# Patient Record
Sex: Female | Born: 1985 | Race: White | Hispanic: No | Marital: Single | Smoking: Current every day smoker
Health system: Southern US, Community
[De-identification: ages and names within clinical notes are randomized; demographics above are authoritative.]

## PROBLEM LIST (undated history)

## (undated) DIAGNOSIS — O00109 Unspecified tubal pregnancy without intrauterine pregnancy: Secondary | ICD-10-CM

## (undated) DIAGNOSIS — F988 Other specified behavioral and emotional disorders with onset usually occurring in childhood and adolescence: Secondary | ICD-10-CM

## (undated) DIAGNOSIS — Q5128 Other doubling of uterus, other specified: Secondary | ICD-10-CM

## (undated) DIAGNOSIS — A549 Gonococcal infection, unspecified: Secondary | ICD-10-CM

## (undated) HISTORY — PX: TONSILLECTOMY: SUR1361

## (undated) HISTORY — DX: Other specified behavioral and emotional disorders with onset usually occurring in childhood and adolescence: F98.8

## (undated) HISTORY — PX: WISDOM TOOTH EXTRACTION: SHX21

---
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
## 2006-05-09 ENCOUNTER — Inpatient Hospital Stay (HOSPITAL_COMMUNITY): Admission: AD | Admit: 2006-05-09 | Discharge: 2006-05-09 | Payer: Self-pay | Admitting: Obstetrics and Gynecology

## 2006-09-01 ENCOUNTER — Ambulatory Visit: Payer: Self-pay | Admitting: Internal Medicine

## 2006-09-01 DIAGNOSIS — F988 Other specified behavioral and emotional disorders with onset usually occurring in childhood and adolescence: Secondary | ICD-10-CM | POA: Insufficient documentation

## 2006-10-17 ENCOUNTER — Telehealth (INDEPENDENT_AMBULATORY_CARE_PROVIDER_SITE_OTHER): Payer: Self-pay | Admitting: *Deleted

## 2006-11-14 ENCOUNTER — Telehealth (INDEPENDENT_AMBULATORY_CARE_PROVIDER_SITE_OTHER): Payer: Self-pay | Admitting: *Deleted

## 2006-12-19 ENCOUNTER — Telehealth (INDEPENDENT_AMBULATORY_CARE_PROVIDER_SITE_OTHER): Payer: Self-pay | Admitting: *Deleted

## 2007-01-18 ENCOUNTER — Telehealth (INDEPENDENT_AMBULATORY_CARE_PROVIDER_SITE_OTHER): Payer: Self-pay | Admitting: *Deleted

## 2007-02-17 ENCOUNTER — Telehealth (INDEPENDENT_AMBULATORY_CARE_PROVIDER_SITE_OTHER): Payer: Self-pay | Admitting: *Deleted

## 2007-03-20 ENCOUNTER — Telehealth (INDEPENDENT_AMBULATORY_CARE_PROVIDER_SITE_OTHER): Payer: Self-pay | Admitting: *Deleted

## 2007-03-30 DIAGNOSIS — Z8619 Personal history of other infectious and parasitic diseases: Secondary | ICD-10-CM

## 2007-03-30 HISTORY — DX: Personal history of other infectious and parasitic diseases: Z86.19

## 2007-04-20 ENCOUNTER — Telehealth: Payer: Self-pay | Admitting: Internal Medicine

## 2007-05-22 ENCOUNTER — Telehealth (INDEPENDENT_AMBULATORY_CARE_PROVIDER_SITE_OTHER): Payer: Self-pay | Admitting: *Deleted

## 2007-06-27 ENCOUNTER — Telehealth (INDEPENDENT_AMBULATORY_CARE_PROVIDER_SITE_OTHER): Payer: Self-pay | Admitting: *Deleted

## 2007-07-04 ENCOUNTER — Ambulatory Visit: Payer: Self-pay | Admitting: Internal Medicine

## 2007-08-25 ENCOUNTER — Telehealth (INDEPENDENT_AMBULATORY_CARE_PROVIDER_SITE_OTHER): Payer: Self-pay | Admitting: *Deleted

## 2007-09-27 ENCOUNTER — Telehealth (INDEPENDENT_AMBULATORY_CARE_PROVIDER_SITE_OTHER): Payer: Self-pay | Admitting: *Deleted

## 2007-10-10 ENCOUNTER — Telehealth (INDEPENDENT_AMBULATORY_CARE_PROVIDER_SITE_OTHER): Payer: Self-pay | Admitting: *Deleted

## 2007-10-27 ENCOUNTER — Telehealth (INDEPENDENT_AMBULATORY_CARE_PROVIDER_SITE_OTHER): Payer: Self-pay | Admitting: *Deleted

## 2007-12-12 ENCOUNTER — Telehealth (INDEPENDENT_AMBULATORY_CARE_PROVIDER_SITE_OTHER): Payer: Self-pay | Admitting: *Deleted

## 2008-01-29 ENCOUNTER — Telehealth (INDEPENDENT_AMBULATORY_CARE_PROVIDER_SITE_OTHER): Payer: Self-pay | Admitting: *Deleted

## 2008-02-29 ENCOUNTER — Telehealth (INDEPENDENT_AMBULATORY_CARE_PROVIDER_SITE_OTHER): Payer: Self-pay | Admitting: *Deleted

## 2008-03-20 ENCOUNTER — Ambulatory Visit: Payer: Self-pay | Admitting: Internal Medicine

## 2008-04-30 ENCOUNTER — Telehealth (INDEPENDENT_AMBULATORY_CARE_PROVIDER_SITE_OTHER): Payer: Self-pay | Admitting: *Deleted

## 2008-05-15 ENCOUNTER — Telehealth (INDEPENDENT_AMBULATORY_CARE_PROVIDER_SITE_OTHER): Payer: Self-pay | Admitting: *Deleted

## 2008-06-14 ENCOUNTER — Telehealth (INDEPENDENT_AMBULATORY_CARE_PROVIDER_SITE_OTHER): Payer: Self-pay | Admitting: *Deleted

## 2008-07-11 ENCOUNTER — Telehealth (INDEPENDENT_AMBULATORY_CARE_PROVIDER_SITE_OTHER): Payer: Self-pay | Admitting: *Deleted

## 2008-08-05 ENCOUNTER — Telehealth (INDEPENDENT_AMBULATORY_CARE_PROVIDER_SITE_OTHER): Payer: Self-pay | Admitting: *Deleted

## 2008-08-28 ENCOUNTER — Ambulatory Visit: Payer: Self-pay | Admitting: Internal Medicine

## 2008-09-25 ENCOUNTER — Telehealth (INDEPENDENT_AMBULATORY_CARE_PROVIDER_SITE_OTHER): Payer: Self-pay | Admitting: *Deleted

## 2008-10-28 ENCOUNTER — Telehealth (INDEPENDENT_AMBULATORY_CARE_PROVIDER_SITE_OTHER): Payer: Self-pay | Admitting: *Deleted

## 2008-11-26 ENCOUNTER — Telehealth (INDEPENDENT_AMBULATORY_CARE_PROVIDER_SITE_OTHER): Payer: Self-pay | Admitting: *Deleted

## 2008-12-23 ENCOUNTER — Telehealth (INDEPENDENT_AMBULATORY_CARE_PROVIDER_SITE_OTHER): Payer: Self-pay | Admitting: *Deleted

## 2009-01-21 ENCOUNTER — Telehealth (INDEPENDENT_AMBULATORY_CARE_PROVIDER_SITE_OTHER): Payer: Self-pay | Admitting: *Deleted

## 2009-02-18 ENCOUNTER — Telehealth (INDEPENDENT_AMBULATORY_CARE_PROVIDER_SITE_OTHER): Payer: Self-pay | Admitting: *Deleted

## 2009-02-19 ENCOUNTER — Emergency Department (HOSPITAL_COMMUNITY): Admission: EM | Admit: 2009-02-19 | Discharge: 2009-02-19 | Payer: Self-pay | Admitting: Emergency Medicine

## 2009-03-19 ENCOUNTER — Telehealth (INDEPENDENT_AMBULATORY_CARE_PROVIDER_SITE_OTHER): Payer: Self-pay | Admitting: *Deleted

## 2009-04-21 ENCOUNTER — Telehealth (INDEPENDENT_AMBULATORY_CARE_PROVIDER_SITE_OTHER): Payer: Self-pay | Admitting: *Deleted

## 2009-05-19 ENCOUNTER — Telehealth (INDEPENDENT_AMBULATORY_CARE_PROVIDER_SITE_OTHER): Payer: Self-pay | Admitting: *Deleted

## 2009-06-13 ENCOUNTER — Telehealth (INDEPENDENT_AMBULATORY_CARE_PROVIDER_SITE_OTHER): Payer: Self-pay | Admitting: *Deleted

## 2009-07-09 ENCOUNTER — Telehealth (INDEPENDENT_AMBULATORY_CARE_PROVIDER_SITE_OTHER): Payer: Self-pay | Admitting: *Deleted

## 2009-08-04 ENCOUNTER — Telehealth (INDEPENDENT_AMBULATORY_CARE_PROVIDER_SITE_OTHER): Payer: Self-pay | Admitting: *Deleted

## 2009-08-29 ENCOUNTER — Telehealth (INDEPENDENT_AMBULATORY_CARE_PROVIDER_SITE_OTHER): Payer: Self-pay | Admitting: *Deleted

## 2009-09-30 ENCOUNTER — Ambulatory Visit: Payer: Self-pay | Admitting: Internal Medicine

## 2009-10-29 ENCOUNTER — Telehealth (INDEPENDENT_AMBULATORY_CARE_PROVIDER_SITE_OTHER): Payer: Self-pay | Admitting: *Deleted

## 2009-11-27 ENCOUNTER — Telehealth (INDEPENDENT_AMBULATORY_CARE_PROVIDER_SITE_OTHER): Payer: Self-pay | Admitting: *Deleted

## 2009-12-29 ENCOUNTER — Telehealth (INDEPENDENT_AMBULATORY_CARE_PROVIDER_SITE_OTHER): Payer: Self-pay | Admitting: *Deleted

## 2010-01-30 ENCOUNTER — Telehealth (INDEPENDENT_AMBULATORY_CARE_PROVIDER_SITE_OTHER): Payer: Self-pay | Admitting: *Deleted

## 2010-02-24 ENCOUNTER — Telehealth: Payer: Self-pay | Admitting: Internal Medicine

## 2010-02-26 ENCOUNTER — Ambulatory Visit: Payer: Self-pay | Admitting: Internal Medicine

## 2010-02-26 DIAGNOSIS — G5600 Carpal tunnel syndrome, unspecified upper limb: Secondary | ICD-10-CM | POA: Insufficient documentation

## 2010-03-02 LAB — CONVERTED CEMR LAB
Free T4: 0.78 ng/dL (ref 0.60–1.60)
TSH: 1.45 microintl units/mL (ref 0.35–5.50)

## 2010-03-26 ENCOUNTER — Telehealth (INDEPENDENT_AMBULATORY_CARE_PROVIDER_SITE_OTHER): Payer: Self-pay | Admitting: *Deleted

## 2010-04-27 ENCOUNTER — Telehealth (INDEPENDENT_AMBULATORY_CARE_PROVIDER_SITE_OTHER): Payer: Self-pay | Admitting: *Deleted

## 2010-04-28 ENCOUNTER — Telehealth (INDEPENDENT_AMBULATORY_CARE_PROVIDER_SITE_OTHER): Payer: Self-pay | Admitting: *Deleted

## 2010-04-28 NOTE — Progress Notes (Signed)
Summary: refill  Phone Note Refill Request Call back at Home Phone [phone]   Refills Requested: Medication #1:  AMPHETAMINE SALT COMBO 15 MG  TABS 1 two times a day prn. call patient when  ready  Initial call taken by: Okey Regal Spring,  August 29, 2009 11:16 AM  Follow-up for Phone Call        Spoke with patient, informed rx is avaliable for pick-up Follow-up by: Shonna Chock,  August 29, 2009 11:39 AM    New/Updated Medications: AMPHETAMINE SALT COMBO 15 MG  TABS (AMPHETAMINE-DEXTROAMPHETAMINE) 1 two times a day as needed **ADDITIONAL REFILLS REQUIRE AN APPOINTMENT** Prescriptions: AMPHETAMINE SALT COMBO 15 MG  TABS (AMPHETAMINE-DEXTROAMPHETAMINE) 1 two times a day as needed **ADDITIONAL REFILLS REQUIRE AN APPOINTMENT**  #60 x 0   Entered by:   Shonna Chock   Authorized by:   Marga Melnick MD   Signed by:   Shonna Chock on 08/29/2009   Method used:   Print then Give to Patient   RxID:   [id number]

## 2010-04-28 NOTE — Progress Notes (Signed)
Summary: Refill Request  Phone Note Refill Request Call back at Home Phone [phone] Message from:  Patient  Refills Requested: Medication #1:  AMPHETAMINE SALT COMBO 15 MG  TABS 1 two times a day prn.  Method Requested: Pick up at Office Initial call taken by: Shonna Chock,  April 21, 2009 10:48 AM  Follow-up for Phone Call        Spoke with patient, patient aware rx is ready for pick-up Follow-up by: Shonna Chock,  April 21, 2009 10:48 AM    Prescriptions: AMPHETAMINE SALT COMBO 15 MG  TABS (AMPHETAMINE-DEXTROAMPHETAMINE) 1 two times a day prn  #60 x 0   Entered by:   Shonna Chock   Authorized by:   Marga Melnick MD   Signed by:   Shonna Chock on 04/21/2009   Method used:   Print then Give to Patient   RxID:   [id number]

## 2010-04-28 NOTE — Progress Notes (Signed)
Summary: refill  Phone Note Refill Request Message from:  Patient on December 29, 2009 1:37 PM  Refills Requested: Medication #1:  AMPHETAMINE SALT COMBO 15 MG  TABS 1 two times a day as needed patient will pick up [id number]  Initial call taken by: Okey Regal Spring,  December 29, 2009 1:37 PM    Prescriptions: AMPHETAMINE SALT COMBO 15 MG  TABS (AMPHETAMINE-DEXTROAMPHETAMINE) 1 two times a day as needed  #60 x 0   Entered by:   Shonna Chock CMA   Authorized by:   Marga Melnick MD   Signed by:   Shonna Chock CMA on 12/30/2009   Method used:   Print then Give to Patient   RxID:   [id number]

## 2010-04-28 NOTE — Assessment & Plan Note (Signed)
Summary: DISCUSS CHANGES TO CONTROLLED MEDS/KB   Vital Signs:  Patient profile:   25 year old female Weight:      181.38 pounds (82.45 kg) Temp:     98.1 degrees F (36.72 degrees C) oral Resp:     15 per minute BP sitting:   118 / 64  (left arm) Cuff size:   regular  Vitals Entered By: Lucious Groves CMA (February 26, 2010 12:18 PM) CC: Discuss changed to controlled med./kb Is Patient Diabetic? No   CC:  Discuss changed to controlled med./kb.  History of Present Illness: Methylphenidate 10 mg two times a day is  ineffective ; "I did not fil the boost I got with the Adderall". She had been on 15 mg two times a day with good response. She was documented to have ADD by testing in 2002(?). Her grades went from" Cs & Ds to straight As" on timed release Adderal. That formulation affected sleep, but the immediate release did not.  Current Medications (verified): 1)  Amphetamine Salt Combo 15 Mg  Tabs (Amphetamine-Dextroamphetamine) .Marland Kitchen.. 1 Two Times A Day As Needed 2)  Methylphenidate Hcl 10 Mg Tabs (Methylphenidate Hcl) .Marland Kitchen.. 1 Two Times A Day As Needed in Place of Adderall  Allergies (verified): No Known Drug Allergies  Review of Systems General:  Denies fatigue, loss of appetite, and sleep disorder; Appetite actually up. CV:  Denies palpitations. GI:  Denies diarrhea. Neuro:  Complains of numbness and tingling; denies tremors; N&T in hands 2-3 X /week upon awakening ,  RUE > LUE.  Physical Exam  General:  well-nourished;alert,appropriate and cooperative throughout examination Eyes:  No corneal or conjunctival inflammation noted. No lid lag Neck:  No deformities, masses, or tenderness noted. Heart:  Normal rate and regular rhythm. S1 and S2 normal without gallop, murmur, click, rub . S4 with slurring Extremities:  No  onycholysis Neurologic:  alert & oriented X3 and DTRs symmetrical and normal.   No tremor. 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 1& 1/2 two times a day as needed  Patient Instructions: 1)  Go to [employer] for Carpal Tunnel Syndrome. EMG/ NCT tests if TFTs are normal & no better with wrist splints @ night. Prescriptions: METHYLPHENIDATE HCL 10 MG TABS (METHYLPHENIDATE HCL) 1& 1/2 two times a day as needed  #90 x 0   Entered and Authorized by:   Marga Melnick MD   Signed by:   Marga Melnick MD on 02/26/2010   Method used:   Print then Give to Patient   RxID:   [id number]    Orders Added: 1)  Est. Patient Level III [85462] 2)  Venipuncture [70350] 3)  TLB-TSH (Thyroid Stimulating Hormone) [84443-TSH] 4)  TLB-T4 (Thyrox), Free [09381-WE9H]

## 2010-04-28 NOTE — Progress Notes (Signed)
Summary: Refill Request  Phone Note Refill Request Call back at Home Phone [phone] Message from:  Patient  Refills Requested: Medication #1:  AMPHETAMINE SALT COMBO 15 MG  TABS 1 two times a day prn.  Method Requested: Pick up at Office Initial call taken by: Shonna Chock,  June 13, 2009 11:33 AM  Follow-up for Phone Call        Patient aware will be ready for pick-up at 12:30pm Follow-up by: Shonna Chock,  June 13, 2009 11:36 AM    Prescriptions: AMPHETAMINE SALT COMBO 15 MG  TABS (AMPHETAMINE-DEXTROAMPHETAMINE) 1 two times a day prn  #60 x 0   Entered by:   Shonna Chock   Authorized by:   Marga Melnick MD   Signed by:   Shonna Chock on 06/13/2009   Method used:   Print then Give to Patient   RxID:   [id number]

## 2010-04-28 NOTE — Progress Notes (Signed)
Summary: rx  Phone Note Call from Patient Call back at Home Phone [phone]   Caller: Patient Summary of Call: adderall 15mg  2 times (the generic) Initial call taken by: Freddy Jaksch,  October 29, 2009 1:09 PM  Follow-up for Phone Call        pt aware rx ready for pick-up in am............................Marland KitchenFelecia Deloach CMA  October 29, 2009 4:43 PM     Prescriptions: AMPHETAMINE SALT COMBO 15 MG  TABS (AMPHETAMINE-DEXTROAMPHETAMINE) 1 two times a day as needed  #60 x 0   Entered by:   Jeremy Johann CMA   Authorized by:   Marga Melnick MD   Signed by:   Jeremy Johann CMA on 10/29/2009   Method used:   Print then Give to Patient   RxID:   [id number]

## 2010-04-28 NOTE — Progress Notes (Signed)
Summary: REFILL  Phone Note Refill Request Message from:  Patient on May 19, 2009 9:04 AM  Refills Requested: Medication #1:  AMPHETAMINE SALT COMBO 15 MG  TABS 1 two times a day prn.  Method Requested: Pick up at Office Next Appointment Scheduled: NO APPT Initial call taken by: Barb Merino,  May 19, 2009 9:05 AM  Follow-up for Phone Call        Spoke with patient and informed her RX will be avaliable for pick-up after 12pm today Follow-up by: Shonna Chock,  May 19, 2009 9:06 AM    Prescriptions: AMPHETAMINE SALT COMBO 15 MG  TABS (AMPHETAMINE-DEXTROAMPHETAMINE) 1 two times a day prn  #60 x 0   Entered by:   Shonna Chock   Authorized by:   Marga Melnick MD   Signed by:   Shonna Chock on 05/19/2009   Method used:   Print then Give to Patient   RxID:   [id number]

## 2010-04-28 NOTE — Progress Notes (Signed)
Summary: adderral refill--she is aware of problem  Phone Note Refill Request Call back at Home Phone [phone] Message from:  Patient on January 30, 2010 1:45 PM  Refills Requested: Medication #1:  AMPHETAMINE SALT COMBO 15 MG  TABS 1 two times a day as needed patient checked with her pharmacy--they checked for her prescription strength and they are out--they did not say whether they had other strengths available---says she has tried the extended time release before--it kept her awake and was very expensive--pt only has meds for today and tomorrow---please call her as soon as prescription is ready for pickup  Initial call taken by: Jerolyn Shin,  January 30, 2010 1:48 PM  Follow-up for Phone Call        Dr.Hopper please choose alternative med for patient  Follow-up by: Shonna Chock CMA,  January 30, 2010 1:50 PM  Additional Follow-up for Phone Call Additional follow up Details #1::        see new Rx Additional Follow-up by: Marga Melnick MD,  January 30, 2010 5:44 PM    Additional Follow-up for Phone Call Additional follow up Details #2::    Patient aware rx is avaliable for pick-up Follow-up by: Shonna Chock CMA,  February 02, 2010 3:53 PM  New/Updated Medications: METHYLPHENIDATE HCL 10 MG TABS (METHYLPHENIDATE HCL) 1 two times a day as needed in place of Adderall Prescriptions: METHYLPHENIDATE HCL 10 MG TABS (METHYLPHENIDATE HCL) 1 two times a day as needed in place of Adderall  #60 x 0   Entered and Authorized by:   Marga Melnick MD   Signed by:   Marga Melnick MD on 01/30/2010   Method used:   Print then Give to Patient   RxID:   [id number]

## 2010-04-28 NOTE — Progress Notes (Signed)
Summary: REFILL  Phone Note Refill Request Call back at Home Phone [phone] Message from:  Patient on July 09, 2009 10:18 AM  Refills Requested: Medication #1:  AMPHETAMINE SALT COMBO 15 MG  TABS 1 two times a day prn.  Method Requested: Pick up at Office Next Appointment Scheduled: NO APPT Initial call taken by: Barb Merino,  July 09, 2009 10:19 AM  Follow-up for Phone Call        Left message on machine informing patient rx will be avaliable after 12pm Follow-up by: Shonna Chock,  July 09, 2009 11:18 AM    Prescriptions: AMPHETAMINE SALT COMBO 15 MG  TABS (AMPHETAMINE-DEXTROAMPHETAMINE) 1 two times a day prn  #60 x 0   Entered by:   Shonna Chock   Authorized by:   Marga Melnick MD   Signed by:   Shonna Chock on 07/09/2009   Method used:   Print then Give to Patient   RxID:   [id number]

## 2010-04-28 NOTE — Progress Notes (Signed)
Summary: Refill/Med Concerns   Phone Note Refill Request Message from:  Patient  Refills Requested: Medication #1:  METHYLPHENIDATE HCL 10 MG TABS 1 two times a day as needed in place of Adderall. patient aware she is requesting refill too soon - patient said some days she needs to take more than dose prescribed - some days she takes 1/2 to 1 a day more  depending on what she has going on at work - patient is willing to pay out of pocket because she knows ins wont pay - this is new med which os replacing adderll -  patient will pick up 113011  Initial call taken by: Okey Regal Spring,  February 24, 2010 12:44 PM  Follow-up for Phone Call        Dr.Hopper please advise on patient changing instructions on med, our instruction indicated 1 by mouth two times a day and patient is taking 1/2-1 additional tab daily. Follow-up by: Shonna Chock CMA,  February 24, 2010 1:33 PM  Additional Follow-up for Phone Call Additional follow up Details #1::        she is going to have to see me because this is a controlled substance Additional Follow-up by: Marga Melnick MD,  February 24, 2010 4:00 PM    Additional Follow-up for Phone Call Additional follow up Details #2::    Patient notified and scheduled appt for Thurs. Follow-up by: Lucious Groves CMA,  February 24, 2010 4:37 PM

## 2010-04-28 NOTE — Progress Notes (Signed)
Summary: adderall script  Phone Note Refill Request Call back at [phone]   Refills Requested: Medication #1:  ADDERALL XR 30 MG CP24 1 by mouth qd. please call when ready  Initial call taken by: Freddy Jaksch,  June 27, 2007 12:06 PM  Follow-up for Phone Call        called and let patient know script ready to be picked up....................................................................Marland KitchenWendall Stade  June 27, 2007 12:59 PM       Prescriptions: ADDERALL XR 30 MG CP24 (AMPHETAMINE-DEXTROAMPHETAMINE) 1 by mouth qd  #30 x 0   Entered by:   Wendall Stade   Authorized by:   Marga Melnick MD   Signed by:   Wendall Stade on 06/27/2007   Method used:   Print then Give to Patient   RxID:   [id number]

## 2010-04-28 NOTE — Progress Notes (Signed)
Summary: Refill R  Phone Note Refill Request Call back at Home Phone [phone] Message from:  Patient  Refills Requested: Medication #1:  AMPHETAMINE SALT COMBO 15 MG  TABS 1 two times a day as needed Initial call taken by: Shonna Chock CMA,  November 27, 2009 4:37 PM  Follow-up for Phone Call        Patient aware rx will be avaliable 1st thing tomorrow Follow-up by: Shonna Chock CMA,  November 27, 2009 4:37 PM    Prescriptions: AMPHETAMINE SALT COMBO 15 MG  TABS (AMPHETAMINE-DEXTROAMPHETAMINE) 1 two times a day as needed  #60 x 0   Entered by:   Shonna Chock CMA   Authorized by:   Marga Melnick MD   Signed by:   Shonna Chock CMA on 11/27/2009   Method used:   Print then Give to Patient   RxID:   [id number]

## 2010-04-28 NOTE — Progress Notes (Signed)
Summary: adderall refill//hop  Phone Note Refill Request Message from:  Patient  Refills Requested: Medication #1:  AMPHETAMINE SALT COMBO 15 MG  TABS 1 two times a day prn. last ov 4/09 last refill #60 01/29/08 Informed pt due for office visit she will schedule when picking up rx  Initial call taken by: Kandice Hams,  February 29, 2008 2:57 PM      Prescriptions: AMPHETAMINE SALT COMBO 15 MG  TABS (AMPHETAMINE-DEXTROAMPHETAMINE) 1 two times a day prn  #60 x 0   Entered by:   Kandice Hams   Authorized by:   Marga Melnick MD   Signed by:   Kandice Hams on 02/29/2008   Method used:   Print then Give to Patient   RxID:   [id number]
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
## 2010-04-28 NOTE — Progress Notes (Signed)
Summary: Refill Request  Phone Note Refill Request Call back at Home Phone [phone] Message from:  Patient on Aug 04, 2009 10:18 AM  Refills Requested: Medication #1:  AMPHETAMINE SALT COMBO 15 MG  TABS 1 two times a day prn.   Dosage confirmed as above?Dosage Confirmed   Supply Requested: 1 month  Method Requested: Pick up at Office Next Appointment Scheduled: none Initial call taken by: Harold Barban,  Aug 04, 2009 10:18 AM  Follow-up for Phone Call        Patient aware RX is avaliable for pick-up Follow-up by: Shonna Chock,  Aug 04, 2009 12:00 PM    Prescriptions: AMPHETAMINE SALT COMBO 15 MG  TABS (AMPHETAMINE-DEXTROAMPHETAMINE) 1 two times a day prn  #60 x 0   Entered by:   Shonna Chock   Authorized by:   Marga Melnick MD   Signed by:   Shonna Chock on 08/04/2009   Method used:   Print then Give to Patient   RxID:   [id number]

## 2010-04-30 NOTE — Progress Notes (Signed)
Summary: REFILL  Phone Note Refill Request Call back at Home Phone [phone] Message from:  Patient on March 26, 2010 3:13 PM  Refills Requested: Medication #1:  METHYLPHENIDATE HCL 10 MG TABS 1& 1/2 two times a day as needed.   Dosage confirmed as above?Dosage Confirmed   Supply Requested: 1 month  Method Requested: Pick up at Office Initial call taken by: Lavell Islam,  March 26, 2010 3:13 PM  Follow-up for Phone Call        Patient aware rx avaliable for pick-up Follow-up by: Shonna Chock CMA,  March 26, 2010 3:57 PM    Prescriptions: METHYLPHENIDATE HCL 10 MG TABS (METHYLPHENIDATE HCL) 1& 1/2 two times a day as needed  #90 x 0   Entered by:   Shonna Chock CMA   Authorized by:   Marga Melnick MD   Signed by:   Shonna Chock CMA on 03/26/2010   Method used:   Print then Give to Patient   RxID:   [id number]

## 2010-05-06 NOTE — Progress Notes (Signed)
Summary: RX Concerns  Phone Note Call from Patient   Caller: Patient Summary of Call: PT CALLING WANT [place] TO WRITE THE SCRIPT FOR AMPHETAMINE SALT COMBS 15 MG  Initial call taken by: Freddy Jaksch,  April 28, 2010 4:00 PM  Follow-up for Phone Call        Previous RX was still in folder for pick-up, rx was placed in shred. I free texted Amphetamin Salt Combo (would not come up in med list provided by EMR) Follow-up by: Shonna Chock CMA,  April 29, 2010 9:57 AM    New/Updated Medications: * AMPHETAMINE SALT COMBO 15 MG TABS 1 by mouth two times a day as needed Prescriptions: AMPHETAMINE SALT COMBO 15 MG TABS 1 by mouth two times a day as needed  #60 x 0   Entered by:   Shonna Chock CMA   Authorized by:   Marga Melnick MD   Signed by:   Shonna Chock CMA on 04/29/2010   Method used:   Print then Give to Patient   RxID:   [id number]

## 2010-05-06 NOTE — Progress Notes (Signed)
Summary: adderral refill  Phone Note Refill Request Message from:  Patient on April 27, 2010 10:26 AM  Refills Requested: Medication #1:  ADDERRAL Wants to go back on adderral from the medication that she was given when adderral was not available  Next Appointment Scheduled: none Initial call taken by: Jerolyn Shin,  April 27, 2010 10:26 AM    New/Updated Medications: AMPHETAMINE-DEXTROAMPHETAMINE 15 MG TABS (AMPHETAMINE-DEXTROAMPHETAMINE) 1 by mouth two times a day as needed Prescriptions: AMPHETAMINE-DEXTROAMPHETAMINE 15 MG TABS (AMPHETAMINE-DEXTROAMPHETAMINE) 1 by mouth two times a day as needed  #60 x 0   Entered by:   Shonna Chock CMA   Authorized by:   Marga Melnick MD   Signed by:   Shonna Chock CMA on 04/27/2010   Method used:   Print then Give to Patient   RxID:   [id number]

## 2010-05-27 ENCOUNTER — Telehealth (INDEPENDENT_AMBULATORY_CARE_PROVIDER_SITE_OTHER): Payer: Self-pay | Admitting: *Deleted

## 2010-06-04 NOTE — Progress Notes (Signed)
Summary: generic adderral refill  Phone Note Refill Request Message from:  Patient on May 27, 2010 2:40 PM  Refills Requested: Medication #1:  AMPHETAMINE SALT COMBO 15 MG TABS 1 by mouth two times a day as needed.   Notes: PATIENT REQUESTS GENERIC ADDERRAL Patient needs Adderall prescription refilled.  Advised patient that it will be ready in 24 hours for pickup unless someone calls them  Initial call taken by: Jerolyn Shin,  May 27, 2010 2:41 PM Caller: Patient    Prescriptions: AMPHETAMINE SALT COMBO 15 MG TABS 1 by mouth two times a day as needed  #60 x 0   Entered by:   Shonna Chock CMA   Authorized by:   Marga Melnick MD   Signed by:   Shonna Chock CMA on 05/27/2010   Method used:   Print then Give to Patient   RxID:   [id number]

## 2010-06-26 ENCOUNTER — Telehealth: Payer: Self-pay | Admitting: Internal Medicine

## 2010-06-26 MED ORDER — AMBULATORY NON FORMULARY MEDICATION: 15.0000 mg | Freq: Two times a day (BID) | Status: DC | PRN

## 2010-06-26 NOTE — Telephone Encounter (Signed)
Pt need adderall 15 mg twice a day. Need today.

## 2010-06-26 NOTE — Telephone Encounter (Signed)
Patient aware rx is available for pick-up, patient does not get off til 5:30pm, patient's boyfriend Adriana Hansen will pick-up

## 2010-06-29 ENCOUNTER — Telehealth: Payer: Self-pay

## 2010-06-29 NOTE — Telephone Encounter (Signed)
Dr.Hopper please advise on dose change for adderall

## 2010-06-29 NOTE — Telephone Encounter (Signed)
Change to the 5 mg pills at same dose X 30 days

## 2010-06-30 ENCOUNTER — Other Ambulatory Visit: Payer: Self-pay

## 2010-06-30 NOTE — Telephone Encounter (Signed)
Patient was on 15 mg bid, patient prefers the 10 mg because it will be less pills than the 5 mg (2 pills tid)

## 2010-06-30 NOTE — Telephone Encounter (Signed)
10 mg pills up to 3 a day as needed would be fine as  she had been on  15 mg twice a day. Total daily  dose would be up to  30 mg. 10 mg pills dispense 90.

## 2010-07-01 ENCOUNTER — Telehealth: Payer: Self-pay

## 2010-07-01 NOTE — Telephone Encounter (Signed)
Hand written rx given to patient, I was unable to get the rx to print out after several attempts

## 2010-07-30 ENCOUNTER — Telehealth: Payer: Self-pay | Admitting: Internal Medicine

## 2010-07-30 MED ORDER — AMBULATORY NON FORMULARY MEDICATION: 10.0000 mg | Status: DC

## 2010-07-30 NOTE — Telephone Encounter (Signed)
Patient wants refill adderall 10mg  (generic) - patient will pick up Friday [phone]

## 2010-07-30 NOTE — Telephone Encounter (Signed)
Patient aware rx is available for pick-up  

## 2010-08-31 ENCOUNTER — Other Ambulatory Visit: Payer: Self-pay | Admitting: Internal Medicine

## 2010-08-31 MED ORDER — AMBULATORY NON FORMULARY MEDICATION: 10.0000 mg | Status: DC

## 2010-08-31 NOTE — Telephone Encounter (Signed)
RX placed at the front for pick up

## 2010-09-27 DIAGNOSIS — Z8759 Personal history of other complications of pregnancy, childbirth and the puerperium: Secondary | ICD-10-CM

## 2010-09-27 HISTORY — DX: Personal history of other complications of pregnancy, childbirth and the puerperium: Z87.59

## 2010-09-28 ENCOUNTER — Other Ambulatory Visit: Payer: Self-pay | Admitting: Internal Medicine

## 2010-09-28 MED ORDER — AMBULATORY NON FORMULARY MEDICATION: 10.0000 mg | Status: DC

## 2010-09-28 MED ORDER — AMBULATORY NON FORMULARY MEDICATION: Status: DC

## 2010-09-28 NOTE — Telephone Encounter (Signed)
I called patient to inform her RX is ready for pick up and will be under Dr.Paz name, patient then asked if she can have 15mg  bid instead of 10mg  1 1/2 by mouth bid. Patient is aware Dr.Hopper is out of the office and another Dr. Stann Mainland have to approve dose change. Dr.Paz please advise

## 2010-09-28 NOTE — Telephone Encounter (Signed)
Refill adderall - 15mg  -patient will pick up Tuesday [id number]

## 2010-09-28 NOTE — Telephone Encounter (Signed)
Ok with me 

## 2010-09-28 NOTE — Telephone Encounter (Signed)
RX for 10 mg was shredded, patient aware Dr.Paz ok'd change in dose and rx at front for pick-up

## 2010-10-13 ENCOUNTER — Encounter (HOSPITAL_COMMUNITY)
Admission: AD | Disposition: A | Discharge status: Home / Self Care | Payer: Self-pay | Source: Ambulatory Visit | Attending: Obstetrics and Gynecology

## 2010-10-13 ENCOUNTER — Encounter (HOSPITAL_COMMUNITY): Payer: Self-pay

## 2010-10-13 ENCOUNTER — Encounter (HOSPITAL_COMMUNITY): Payer: Self-pay | Admitting: Anesthesiology

## 2010-10-13 ENCOUNTER — Inpatient Hospital Stay (HOSPITAL_COMMUNITY): Payer: BC Managed Care – PPO

## 2010-10-13 ENCOUNTER — Encounter (HOSPITAL_COMMUNITY): Payer: Self-pay | Admitting: Obstetrics and Gynecology

## 2010-10-13 ENCOUNTER — Other Ambulatory Visit: Payer: Self-pay | Admitting: Obstetrics and Gynecology

## 2010-10-13 ENCOUNTER — Ambulatory Visit: Admit: 2010-10-13 | Payer: Self-pay | Admitting: Obstetrics and Gynecology

## 2010-10-13 ENCOUNTER — Inpatient Hospital Stay (HOSPITAL_COMMUNITY): Payer: BC Managed Care – PPO | Admitting: Anesthesiology

## 2010-10-13 ENCOUNTER — Ambulatory Visit (HOSPITAL_COMMUNITY)
Admission: AD | Admit: 2010-10-13 | Discharge: 2010-10-13 | Disposition: A | Discharge status: Home / Self Care | Payer: BC Managed Care – PPO | Source: Ambulatory Visit | Attending: Obstetrics and Gynecology | Admitting: Obstetrics and Gynecology

## 2010-10-13 DIAGNOSIS — O009 Unspecified ectopic pregnancy without intrauterine pregnancy: Secondary | ICD-10-CM

## 2010-10-13 DIAGNOSIS — R1031 Right lower quadrant pain: Secondary | ICD-10-CM

## 2010-10-13 DIAGNOSIS — O00109 Unspecified tubal pregnancy without intrauterine pregnancy: Secondary | ICD-10-CM | POA: Insufficient documentation

## 2010-10-13 HISTORY — PX: LAPAROSCOPY: SHX197

## 2010-10-13 HISTORY — DX: Gonococcal infection, unspecified: A54.9

## 2010-10-13 LAB — HCG, QUANTITATIVE, PREGNANCY: hCG, Beta Chain, Quant, S: 2912 m[IU]/mL — ABNORMAL HIGH (ref <5)

## 2010-10-13 LAB — DIFFERENTIAL
Eosinophils Absolute: 0.1 10*3/uL (ref 0.0–0.7)
Eosinophils Relative: 1 % (ref 0–5)
Lymphocytes Relative: 15 % (ref 12–46)
Lymphs Abs: 2.5 10*3/uL (ref 0.7–4.0)
Monocytes Absolute: 1.2 10*3/uL — ABNORMAL HIGH (ref 0.1–1.0)

## 2010-10-13 LAB — CBC
Hemoglobin: 12.8 g/dL (ref 12.0–15.0)
MCH: 29.7 pg (ref 26.0–34.0)
MCHC: 33.4 g/dL (ref 30.0–36.0)
RDW: 13 % (ref 11.5–15.5)

## 2010-10-13 LAB — ABO/RH: ABO/RH(D): AB POS

## 2010-10-13 SURGERY — LAPAROSCOPY OPERATIVE
Anesthesia: General

## 2010-10-13 MED ORDER — DEXAMETHASONE SODIUM PHOSPHATE 10 MG/ML IJ SOLN
INTRAMUSCULAR | Status: DC | PRN
  Administered 2010-10-13: 10 mg via INTRAVENOUS

## 2010-10-13 MED ORDER — MIDAZOLAM HCL 5 MG/5ML IJ SOLN
INTRAMUSCULAR | Status: DC | PRN
Start: 2010-10-13 — End: 2010-10-13
  Administered 2010-10-13: 2 mg via INTRAVENOUS

## 2010-10-13 MED ORDER — HYDROCODONE-ACETAMINOPHEN 5-500 MG PO TABS: 1.0000 | ORAL_TABLET | ORAL | Status: AC | PRN

## 2010-10-13 MED ORDER — PROPOFOL 10 MG/ML IV EMUL
INTRAVENOUS | Status: DC | PRN
  Administered 2010-10-13: 200 mg via INTRAVENOUS

## 2010-10-13 MED ORDER — GLYCOPYRROLATE 0.2 MG/ML IJ SOLN
INTRAMUSCULAR | Status: DC | PRN
  Administered 2010-10-13: .4 mg via INTRAVENOUS

## 2010-10-13 MED ORDER — LACTATED RINGERS IR SOLN
Status: DC | PRN
  Administered 2010-10-13 (×2): 3000 mL

## 2010-10-13 MED ORDER — NEOSTIGMINE METHYLSULFATE 1 MG/ML IJ SOLN
INTRAMUSCULAR | Status: DC | PRN
  Administered 2010-10-13: 2 mg via INTRAMUSCULAR

## 2010-10-13 MED ORDER — KETOROLAC TROMETHAMINE 30 MG/ML IJ SOLN: 15.0000 mg | Freq: Once | INTRAMUSCULAR | Status: DC | PRN

## 2010-10-13 MED ORDER — ONDANSETRON HCL 4 MG/2ML IJ SOLN: 4.0000 mg | Freq: Once | INTRAMUSCULAR | Status: DC | PRN

## 2010-10-13 MED ORDER — KETOROLAC TROMETHAMINE 60 MG/2ML IM SOLN
INTRAMUSCULAR | Status: DC | PRN
  Administered 2010-10-13: 30 mg via INTRAMUSCULAR

## 2010-10-13 MED ORDER — FENTANYL CITRATE 0.05 MG/ML IJ SOLN
INTRAMUSCULAR | Status: DC | PRN
  Administered 2010-10-13: 100 ug via INTRAVENOUS
  Administered 2010-10-13: 150 ug via INTRAVENOUS
  Administered 2010-10-13: 100 ug via INTRAVENOUS
  Administered 2010-10-13: 150 ug via INTRAVENOUS

## 2010-10-13 MED ORDER — IBUPROFEN 200 MG PO TABS: 200.0000 mg | ORAL_TABLET | Freq: Four times a day (QID) | ORAL | Status: DC | PRN

## 2010-10-13 MED ORDER — BUPIVACAINE HCL (PF) 0.25 % IJ SOLN
INTRAMUSCULAR | Status: DC | PRN
  Administered 2010-10-13: 9 mL

## 2010-10-13 MED ORDER — KETOROLAC TROMETHAMINE 30 MG/ML IJ SOLN
INTRAMUSCULAR | Status: DC | PRN
  Administered 2010-10-13: 30 mg via INTRAVENOUS

## 2010-10-13 MED ORDER — HYDROMORPHONE HCL 1 MG/ML IJ SOLN: 0.2500 mg | INTRAMUSCULAR | Status: DC | PRN

## 2010-10-13 MED ORDER — LACTATED RINGERS IV SOLN
INTRAVENOUS | Status: DC
  Administered 2010-10-13 (×2): via INTRAVENOUS

## 2010-10-13 MED ORDER — ROCURONIUM BROMIDE 100 MG/10ML IV SOLN
INTRAVENOUS | Status: DC | PRN
Start: 2010-10-13 — End: 2010-10-13
  Administered 2010-10-13: 10 mg via INTRAVENOUS
  Administered 2010-10-13: 20 mg via INTRAVENOUS

## 2010-10-13 MED ORDER — SODIUM CHLORIDE 0.9 % IV SOLN: Freq: Once | INTRAVENOUS | Status: DC

## 2010-10-13 MED ORDER — FAMOTIDINE IN NACL 20-0.9 MG/50ML-% IV SOLN
20.0000 mg | Freq: Once | INTRAVENOUS | Status: AC
  Administered 2010-10-13: 20 mg via INTRAVENOUS
  Filled 2010-10-13: qty 50

## 2010-10-13 MED ORDER — ONDANSETRON HCL 4 MG/2ML IJ SOLN
INTRAMUSCULAR | Status: DC | PRN
  Administered 2010-10-13: 4 mg via INTRAVENOUS

## 2010-10-13 MED ORDER — LIDOCAINE HCL (CARDIAC) 20 MG/ML IV SOLN
INTRAVENOUS | Status: DC | PRN
  Administered 2010-10-13: 60 mg via INTRAVENOUS

## 2010-10-13 MED ORDER — MEPERIDINE HCL 25 MG/ML IJ SOLN: 6.2500 mg | INTRAMUSCULAR | Status: DC | PRN

## 2010-10-13 MED ORDER — SUCCINYLCHOLINE CHLORIDE 20 MG/ML IJ SOLN
INTRAMUSCULAR | Status: DC | PRN
  Administered 2010-10-13: 140 mg via INTRAVENOUS

## 2010-10-13 SURGICAL SUPPLY — 29 items
APL SKNCLS STERI-STRIP NONHPOA (GAUZE/BANDAGES/DRESSINGS) ×1
BAG SPEC RTRVL LRG 6X4 10 (ENDOMECHANICALS) ×1
BENZOIN TINCTURE PRP APPL 2/3 (GAUZE/BANDAGES/DRESSINGS) ×1 IMPLANT
CABLE HIGH FREQUENCY MONO STRZ (ELECTRODE) IMPLANT
CATH ROBINSON RED A/P 16FR (CATHETERS) ×1 IMPLANT
CHLORAPREP W/TINT 26ML (MISCELLANEOUS) ×2 IMPLANT
CLOTH BEACON ORANGE TIMEOUT ST (SAFETY) ×2 IMPLANT
COVER TABLE BACK 60X90 (DRAPES) ×2 IMPLANT
DERMABOND ADVANCED (GAUZE/BANDAGES/DRESSINGS) ×1 IMPLANT
DRAPE UTILITY XL STRL (DRAPES) ×2 IMPLANT
EVACUATOR SMOKE 8.L (FILTER) ×1 IMPLANT
GLOVE BIO SURGEON STRL SZ 6.5 (GLOVE) ×2 IMPLANT
GLOVE BIO SURGEON STRL SZ7 (GLOVE) ×2 IMPLANT
GOWN BRE IMP SLV AUR LG STRL (GOWN DISPOSABLE) ×5 IMPLANT
NDL INSUFFLATION 14GA 120MM (NEEDLE) IMPLANT
NEEDLE INSUFFLATION 14GA 120MM (NEEDLE) ×2 IMPLANT
NS IRRIG 1000ML POUR BTL (IV SOLUTION) ×2 IMPLANT
PACK LAPAROSCOPY BASIN (CUSTOM PROCEDURE TRAY) ×2 IMPLANT
POUCH SPECIMEN RETRIEVAL 10MM (ENDOMECHANICALS) ×1 IMPLANT
SCALPEL HARMONIC ACE (MISCELLANEOUS) ×1 IMPLANT
SET IRRIG TUBING LAPAROSCOPIC (IRRIGATION / IRRIGATOR) ×1 IMPLANT
STRIP CLOSURE SKIN 1/4X3 (GAUZE/BANDAGES/DRESSINGS) ×1 IMPLANT
SUT VICRYL 0 UR6 27IN ABS (SUTURE) IMPLANT
SUT VICRYL 4-0 PS2 18IN ABS (SUTURE) ×2 IMPLANT
TOWEL OR 17X24 6PK STRL BLUE (TOWEL DISPOSABLE) ×4 IMPLANT
TROCAR XCEL NON-BLD 11X100MML (ENDOMECHANICALS) ×1 IMPLANT
TROCAR XCEL NON-BLD 5MMX100MML (ENDOMECHANICALS) ×4 IMPLANT
WARMER LAPAROSCOPE (MISCELLANEOUS) ×2 IMPLANT
WATER STERILE IRR 1000ML POUR (IV SOLUTION) ×1 IMPLANT

## 2010-10-13 NOTE — Progress Notes (Signed)
Adriana Hoover, pa at bedside, discussed ultrasound report and that Dr. Senaida Ores will be in to see patient. Patient is crying, tissues given. Emotional support given and heartstring information given including heart pillow. Patient states that she has no pain when she is in bed, she only feels pain when she moves or coughs.

## 2010-10-13 NOTE — Transfer of Care (Signed)
Immediate Anesthesia Transfer of Care Note  Patient: Adriana Hansen  Procedure(s) Performed:  LAPAROSCOPY OPERATIVE - Lysis of Adhesions. Right Salpingooophrectomy,Evacuation of Hematoperitoneum  Patient Location: PACU  Anesthesia Type: General  Level of Consciousness: awake, alert  and oriented  Airway & Oxygen Therapy: Patient Spontanous Breathing and Patient connected to nasal cannula oxygen  Post-op Assessment: Report given to PACU RN and Post -op Vital signs reviewed and stable  Post vital signs: Reviewed and stable  Complications: No apparent anesthesia complications

## 2010-10-13 NOTE — Anesthesia Preprocedure Evaluation (Signed)
Anesthesia Evaluation  Name, MR# and DOB Patient awake  General Assessment Comment  Reviewed: Allergy & Precautions, H&P  and Patient's Chart, lab work & pertinent test results  History of Anesthesia Complications (+) MALIGNANT HYPERTHERMIA  Airway Mallampati: I TM Distance: >3 FB Neck ROM: full    Dental  (+) Teeth Intact   Pulmonaryneg pulmonary ROS    clear to auscultation    Cardiovascular regular Normal   Neuro/PsychNegative Neurological ROS Negative Psych ROS  GI/Hepatic/Renal negative GI ROS, negative Liver ROS, and negative Renal ROS (+)       Endo/Other  Negative Endocrine ROS (+)   Abdominal   Musculoskeletal  Hematology negative hematology ROS (+)   Peds  Reproductive/Obstetrics negative OB ROS   Anesthesia Other Findings             Anesthesia Physical Anesthesia Plan  ASA: II and Emergent  Anesthesia Plan: General   Post-op Pain Management:    Induction:   Airway Management Planned:   Additional Equipment:   Intra-op Plan:   Post-operative Plan:   Informed Consent: I have reviewed the patients History and Physical, chart, labs and discussed the procedure including the risks, benefits and alternatives for the proposed anesthesia with the patient or authorized representative who has indicated his/her understanding and acceptance.   Dental Advisory Given  Plan Discussed with: CRNA  Anesthesia Plan Comments:         Anesthesia Quick Evaluation

## 2010-10-13 NOTE — Progress Notes (Signed)
Pt states she has been having abnomal rise in BHCG's. Had sudden onset of lower abdominal pain, got nauseated, lightheaded and sweaty. Felt a little better after lying down and cooling down. Sent to MAU to be evaluated. Has had some bleeding since last week.

## 2010-10-13 NOTE — ED Provider Notes (Addendum)
History    patient is a 25 year old white female who is a G1. She presents today after having a sudden onset of right lower quadrant pain. She has also had vaginal bleeding for the past several days. She has been followed in the office by Dr. Ellyn Hack for abnormally rising beta quantitative hCG. She was due to go back to the office today for repeat beta quantitative hCG and ultrasound. However, she began having this right lower quadrant pain and was told to present to the MA U. She states that she is comfortable as long as she is lying still. However, if she moves, coughs, or does any Valsalva she has severe pain.  Chief Complaint  Patient presents with  . Abdominal Pain   HPI  OB History    Grav Para Term Preterm Abortions TAB SAB Ect Mult Living   1               Past Medical History  Diagnosis Date  . Gonorrhea     Past Surgical History  Procedure Date  . Tonsillectomy   . Wisdom tooth extraction     Family History  Problem Relation Age of Onset  . Hyperlipidemia Father   . Hyperlipidemia Brother   . Diabetes Maternal Grandfather     History  Substance Use Topics  . Smoking status: Current Everyday Smoker -- 0.2 packs/day  . Smokeless tobacco: Not on file  . Alcohol Use: No    Allergies: No Known Allergies  Prescriptions prior to admission  Medication Sig Dispense Refill  . amphetamine-dextroamphetamine (ADDERALL) 15 MG tablet Take 15 mg by mouth 2 (two) times daily.        . AMBULATORY NON FORMULARY MEDICATION Amphetamine Salt Combo 15 mg: 1 by mouth two times daily         Review of Systems  Constitutional: Negative for fever and chills.  Cardiovascular: Negative for chest pain.  Gastrointestinal: Positive for abdominal pain. Negative for nausea, vomiting, diarrhea and constipation.  Genitourinary: Negative for dysuria, urgency, frequency, hematuria and flank pain.  Neurological: Negative for dizziness and headaches.  Psychiatric/Behavioral: Negative for  depression and suicidal ideas.   Physical Exam   Blood pressure 132/63, pulse 87, temperature 97.7 F (36.5 C), temperature source Oral, resp. rate 18, height 5\' 5"  (1.651 m), weight 195 lb 6.4 oz (88.633 kg), last menstrual period 09/07/2010, SpO2 99.00%.  Physical Exam  Constitutional: She is oriented to person, place, and time. She appears well-developed and well-nourished. No distress.  HENT:  Head: Normocephalic and atraumatic.  Eyes: EOM are normal. Pupils are equal, round, and reactive to light.  GI: Soft. She exhibits no distension. There is tenderness. There is no rebound and no guarding.  Neurological: She is alert and oriented to person, place, and time.  Skin: Skin is warm and dry. She is not diaphoretic.  Psychiatric: She has a normal mood and affect. Her behavior is normal. Judgment and thought content normal.    MAU Course  Procedures  *RADIOLOGY REPORT*  Clinical Data: Positive pregnancy test, pelvic pain in the right  lower quadrant, vaginal bleeding. 5-week-1-day gestational age by  LMP.  OBSTETRIC <14 WK [place] AND TRANSVAGINAL OB US  Technique: Both transabdominal and transvaginal ultrasound  examinations were performed for complete evaluation of the  gestation as well as the maternal uterus, adnexal regions, and  pelvic cul-de-sac. Transvaginal technique was performed to assess  early pregnancy.  Comparison: None.  Intrauterine gestational sac: Not definitely identified. Probable  fluid within  the endometrial canal. 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  Yolk sac: Not visualized  Embryo: Not visualized  Cardiac Activity: Not visualized  Maternal uterus/adnexae:  Left ovarian simple appearing cyst that measures 4.1 x 3.9 x 3.2  cm.  In the right adnexa, there is a complex mass like lesion measuring  5.7 x 5.5 x 2.3 cm. No internal gestational sac is identified  within this mass.   There is a large amount of free fluid within the pelvis, with  internal echoes present.  IMPRESSION:  Fluid within the endometrial canal, but no definite gestational sac  identified. 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 At the junction of the uterine horns, there are  echoes present within the area of apparent fluid, which could  represent blood/debris. An irregular gestational sac is less  likely but possible.  Right adnexal complex mass, without an identifiable right ovary  separate from this mass. In the absence of a definitive  intrauterine gestational sac, the constellation of findings makes  ectopic pregnancy likely. Differential considerations also include  tubo-ovarian abscess or hemorrhagic cyst, or intrauterine gestation  too early to be sonographically visible. The appearance would be  unlikely to represent endometrioma, although the appearance of  endometriosis may be variable.  Simple appearing left ovarian cyst. Most likely this represents a  corpus luteum or less likely functional cyst.  Depending on the patient's hemodynamic status and other clinical  indicators, alternatives to laparoscopy could include short-term  follow-up ultrasound and correlation with serial quantitative beta  HCG levels.  These results were called by telephone on 10/13/2010 at 6:10 p.m.  to Henrietta Hoover, PA, who verbally acknowledged these results.  Original Report Authenticated By: Harrel Lemon, M.D.  Results for orders placed during the hospital encounter of 10/13/10 (from the past 24 hour(s))  CBC     Status: Abnormal   Collection Time   10/13/10  4:33 PM      Component Value Range   WBC 16.8 (*) 4.0 - 10.5 (K/uL)   RBC 4.31  3.87 - 5.11 (MIL/uL)   Hemoglobin 12.8  12.0 - 15.0 (g/dL)   HCT 16.1  09.6 - 04.5 (%)   MCV 88.9  78.0 - 100.0 (fL)   MCH 29.7  26.0 - 34.0 (pg)   MCHC 33.4  30.0 - 36.0 (g/dL)   RDW 40.9  81.1 - 91.4 (%)   Platelets  312  150 - 400 (K/uL)  HCG, QUANTITATIVE, PREGNANCY     Status: Abnormal   Collection Time   10/13/10  4:33 PM      Component Value Range   hCG, Beta Chain, Quant, S 2912 (*) <5 (mIU/mL)  ABO/RH     Status: Normal   Collection Time   10/13/10  4:50 PM      Component Value Range   ABO/RH(D) AB POS    DIFFERENTIAL     Status: Abnormal   Collection Time   10/13/10  4:52 PM      Component Value Range   Neutrophils Relative 77  43 - 77 (%)   Neutro Abs 12.9 (*) 1.7 - 7.7 (K/uL)   Lymphocytes Relative 15  12 - 46 (%)   Lymphs Abs 2.5  0.7 - 4.0 (K/uL)   Monocytes Relative 7  3 - 12 (%)   Monocytes Absolute 1.2 (*) 0.1 - 1.0 (K/uL)   Eosinophils Relative 1  0 - 5 (%)   Eosinophils Absolute 0.1  0.0 - 0.7 (K/uL)   Basophils Relative 0  0 - 1 (%)   Basophils Absolute 0.0  0.0 - 0.1 (K/uL)    Assessment and Plan  Probable ruptured ectopic pregnancy: Dr. Senaida Ores and Dr. Ellyn Hack had been notified of all findings. Dr. Ellyn Hack is on her way to take the patient to surgery. I have discussed this with the patient at length. I have discussed the surgical procedure including all pros, cons, risks, and alternatives. The patient wishes to proceed.  Clinton Gallant. Rice III, DrHSc, MPAS, PA-C  10/13/2010, 6:51 PM   Henrietta Hoover, PA 10/23/10 [phone]

## 2010-10-13 NOTE — Brief Op Note (Signed)
10/13/2010  9:57 PM  PATIENT:  Adriana Hansen  25 y.o. female  PRE-OPERATIVE DIAGNOSIS:  Ruptured Ectopic  POST-OPERATIVE DIAGNOSIS:  Ruptured Ectopic  PROCEDURE:  Procedure(s): LAPAROSCOPY OPERATIVE, RSO, LOA, evacuation of hematoperitoneum  SURGEON:  Surgeon(s): Trammell Bowden Bovard  ANESTHESIA:   local and general  ESTIMATED BLOOD LOSS: 200cc    IVF 1500cc  uop 45occ  LOCAL MEDICATIONS USED: 0.25%  MARCAINE 9 CC  SPECIMEN:  Source of Specimen:  RSO, ectopic  DISPOSITION OF SPECIMEN:  PATHOLOGY  COUNTS:  YES  DICTATION #: [id number]  PLAN OF CARE: D/C home  PATIENT DISPOSITION:  home

## 2010-10-13 NOTE — Progress Notes (Signed)
10/13/10 25yo G1 with abnormally rising quant and pain.  US reveals R complex adnexal mass, hemoperitoneum.  Hitory reviewed with pt, no changes from PA H&P.  D/W pt ectopic pregnancy and treatment.  Reviewed  With pt R/B/A of surgery inc, but not limited to bleeding, infecvtion, damage to surrounding organs, trouble healing.  Pt's Q's answered, will proceed with op L/S, poss RSO (unilateral), poss ex-lap, poss D&C.   Blood type AB+, Hgb 12, BHCG = 2900.  JBOVARD

## 2010-10-13 NOTE — Anesthesia Postprocedure Evaluation (Signed)
Vital signs stable Patient alert Pain and nausea are controlled No apparent anesthetic complications No follow up care needed 

## 2010-10-13 NOTE — Progress Notes (Signed)
Patient states that she started bleeding last week and had a bhcg of 2000 at her dr's office. The bhcg was repeated yesterday at her dr's office and has not risen. She is here with c/o of sudden onset of mid-right lower quadrant pain. She is still having scant bleeding after wiping. She is mildly tender to touch. Rates the 8/10. She is also nauseated.

## 2010-10-13 NOTE — Progress Notes (Signed)
Dr. Senaida Ores notified of probable ectopic pregnancy.

## 2010-10-14 NOTE — Op Note (Signed)
NAMEDONETA, BAYMAN NO.:  [id number]  MEDICAL RECORD NO.:  [id number]  LOCATION:  [facility]                          FACILITY:  [facility]  PHYSICIAN:  Sherron Monday, MD        DATE OF BIRTH:  03/09/1986  DATE OF PROCEDURE:  10/13/2010 DATE OF DISCHARGE:  10/13/2010                              OPERATIVE REPORT   PREOPERATIVE DIAGNOSIS:  Ectopic pregnancy, ruptured.  POSTOPERATIVE DIAGNOSIS:  Ectopic pregnancy, ruptured.  PROCEDURE:  Operative laparoscopy, right salpingo-oophorectomy, lysis of adhesions, evacuation of hematoperitoneum.  SURGEON:  Sherron Monday, MD.  ANESTHESIA:  Local and general anesthesia.  Local anesthetic with 9 mL of 0.25% Marcaine.  ESTIMATED BLOOD LOSS:  200 mL.  IV FLUIDS:  1500 mL.  URINE OUTPUT:  450 mL at the end of the procedure.  PATHOLOGY:  None.  COMPLICATIONS:  None.  FINDINGS:  Left ovarian filmy adhesions, slightly bicornuate appearing uterus and right ectopic pregnancy with very swollen  tube, hematoperitoneum was approximately 100 mL.  DESCRIPTION OF PROCEDURE:  After informed consent was reviewed with the patient including risks, benefits, and alternatives in stable condition, placed on the table where general anesthesia was then induced and found to be adequate.  She was then placed in the yellow fin stirrups, prepped and draped in the normal sterile fashion.  A Foley catheter was sterilely placed.  The patient was prepped and draped in normal sterile fashion.  With an open-sided speculum and a single-tooth tenaculum, her cervix was easily identified and a Hulka tenaculum was placed.  Gloves were changed.  Attention was turned to the abdominal portion of the case.  Approximately 1 cm vertical infraumbilical incision was made using the Veress.  Pneumoperitoneum was obtained, carefully checking to make sure placement with hanging drop test.  The trocar was placed under direct visualization.  Brief pelvic survey with the above  mentioned findings and with the hematoperitoneum. Accessory ports were placed on the left and right and on the left, there was a 10-mm port under direct visualization after carefully outlining the epigastric vessels, on the right, 5-mm port was placed after carefully checking the anatomy.  Using a suction irrigator, the clot was evacuated.  Adhesions were bluntly and with the Harmonic dissected.   The ectopic  was outlined and easily identified on the right, grasped with a atraumatic grasper and the tube and ovary were excised carefully making sure that the pedicles were hemostatic.  The tube and ovary were placed in an EndoCatch bag.  This was removed with both of the products. A careful pelvic survey was performed revealing no endometriosis and hemostatic pedicles.  The trocars were removed under direct visualization and the sutures were placed in all three trocars.  The skin was closed on the 10 mm trocar site with 4-0 Vicryl.  Benzoin and Steri-Strips were applied.  Prior to removal of the trocars, the pneumoperitoneum was evacuated to the best of our abilities.  The 5-mm trocars  each had a  deep suture of 4-0 Vicryl and the skin was closed with Dermabond.  The patient tolerated the procedure well.  Sponge, lap, and needle counts were correct x2 at the end of  procedure per operating room staff.     Sherron Monday, MD     JB/MEDQ  D:  10/13/2010  T:  10/14/2010  Job:  [id number]

## 2010-10-29 ENCOUNTER — Encounter (HOSPITAL_COMMUNITY): Payer: Self-pay | Admitting: Obstetrics and Gynecology

## 2010-10-29 ENCOUNTER — Other Ambulatory Visit: Payer: Self-pay | Admitting: Internal Medicine

## 2010-10-29 MED ORDER — AMPHETAMINE-DEXTROAMPHETAMINE 15 MG PO TABS: 15.0000 mg | ORAL_TABLET | Freq: Two times a day (BID) | ORAL | Status: DC

## 2010-11-13 HISTORY — PX: RIGHT OOPHORECTOMY: SHX2359

## 2010-11-27 ENCOUNTER — Other Ambulatory Visit: Payer: Self-pay | Admitting: Internal Medicine

## 2010-11-27 MED ORDER — AMPHETAMINE-DEXTROAMPHETAMINE 15 MG PO TABS: 15.0000 mg | ORAL_TABLET | Freq: Two times a day (BID) | ORAL | Status: DC

## 2010-11-27 NOTE — Telephone Encounter (Signed)
Refill adderall - patient will pick up Tuesday [phone]

## 2010-12-10 ENCOUNTER — Encounter: Payer: Self-pay | Admitting: Internal Medicine

## 2010-12-10 ENCOUNTER — Ambulatory Visit (INDEPENDENT_AMBULATORY_CARE_PROVIDER_SITE_OTHER): Payer: BC Managed Care – PPO | Admitting: Internal Medicine

## 2010-12-10 VITALS — BP 124/78 | HR 114 | Wt 189.8 lb

## 2010-12-10 DIAGNOSIS — F988 Other specified behavioral and emotional disorders with onset usually occurring in childhood and adolescence: Secondary | ICD-10-CM

## 2010-12-10 MED ORDER — AMPHETAMINE-DEXTROAMPHETAMINE 15 MG PO TABS: 15.0000 mg | ORAL_TABLET | Freq: Two times a day (BID) | ORAL | Status: DC

## 2010-12-10 NOTE — Progress Notes (Signed)
  Subjective:    Patient ID: Adriana Hansen, female    DOB: [birth year], 25 y.o.   MRN: [id number]  HPI  ADD monitoring: Original date of diagnosis:2002 Confirmatory testing:yes Major symptoms: Focus:good Triage ability:good Hyperactivity:no  Medications/response:good response Ineffective medications/adverse effects to date:extended release affected sleep Review of systems: Weight change:no Headache:no Insomnia:no Fatigue:no Change in appetite:no Visual changes:no Palpitations, racing, cardiac irregularities:no Nausea/vomiting:no Changes in bowel habits:no Tremor:no Numbness and tingling:no Anxiety/panic:no Depression:no Mood swings:no Temperature intolerance:no Skin/hair/nail changes: no    Review of Systems     Objective:   Physical Exam  Gen.:  well-nourished; in no acute distress Eyes: Extraocular motion intact; no lid lag or proptosis Neck: no thyroid findings Heart: Normal rhythm and rate without significant murmur, gallop, or extra heart sounds Lungs: Chest clear to auscultation without rales,rales, wheezes Neuro:Deep tendon reflexes are equal and within normal limits; no tremor  Skin: Warm and dry without significant lesions or rashes; no onycholysis Psych: Normally communicative and interactive; no abnormal mood or affect clinically.         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

## 2010-12-10 NOTE — Patient Instructions (Signed)
   Meds as per instructions. Please report any potential or possible adverse effects.

## 2011-01-20 ENCOUNTER — Other Ambulatory Visit: Payer: Self-pay | Admitting: Internal Medicine

## 2011-01-20 DIAGNOSIS — F988 Other specified behavioral and emotional disorders with onset usually occurring in childhood and adolescence: Secondary | ICD-10-CM

## 2011-01-21 MED ORDER — AMPHETAMINE-DEXTROAMPHETAMINE 15 MG PO TABS: 15.0000 mg | ORAL_TABLET | Freq: Two times a day (BID) | ORAL | Status: DC

## 2011-01-21 NOTE — Telephone Encounter (Signed)
Left message on voice mail informing patient rx available for pick-up

## 2011-02-24 ENCOUNTER — Telehealth: Payer: Self-pay | Admitting: Internal Medicine

## 2011-02-24 DIAGNOSIS — F988 Other specified behavioral and emotional disorders with onset usually occurring in childhood and adolescence: Secondary | ICD-10-CM

## 2011-02-24 NOTE — Telephone Encounter (Signed)
Refill  adderall - patient will pick up Friday 11.30.12

## 2011-02-25 MED ORDER — AMPHETAMINE-DEXTROAMPHETAMINE 15 MG PO TABS: 15.0000 mg | ORAL_TABLET | Freq: Two times a day (BID) | ORAL | Status: DC

## 2011-02-25 NOTE — Telephone Encounter (Signed)
Rx printed

## 2011-03-24 ENCOUNTER — Telehealth: Payer: Self-pay | Admitting: Internal Medicine

## 2011-03-24 DIAGNOSIS — F988 Other specified behavioral and emotional disorders with onset usually occurring in childhood and adolescence: Secondary | ICD-10-CM

## 2011-03-24 NOTE — Telephone Encounter (Signed)
Patient is requesting a refill for adderall

## 2011-03-25 MED ORDER — AMPHETAMINE-DEXTROAMPHETAMINE 15 MG PO TABS: 15.0000 mg | ORAL_TABLET | Freq: Two times a day (BID) | ORAL | Status: DC

## 2011-03-25 NOTE — Telephone Encounter (Signed)
OK x 1 month 

## 2011-03-25 NOTE — Telephone Encounter (Signed)
Please advise Adderall refill?

## 2011-03-25 NOTE — Telephone Encounter (Signed)
Patient aware rx available for pick-up  

## 2011-04-29 ENCOUNTER — Other Ambulatory Visit: Payer: Self-pay

## 2011-04-29 DIAGNOSIS — F988 Other specified behavioral and emotional disorders with onset usually occurring in childhood and adolescence: Secondary | ICD-10-CM

## 2011-04-29 MED ORDER — AMPHETAMINE-DEXTROAMPHETAMINE 15 MG PO TABS: 15.0000 mg | ORAL_TABLET | Freq: Two times a day (BID) | ORAL | Status: DC

## 2011-04-29 NOTE — Telephone Encounter (Signed)
Patient aware rx is ready for pickup. 

## 2011-06-02 ENCOUNTER — Telehealth: Payer: Self-pay | Admitting: Internal Medicine

## 2011-06-02 DIAGNOSIS — F988 Other specified behavioral and emotional disorders with onset usually occurring in childhood and adolescence: Secondary | ICD-10-CM

## 2011-06-02 MED ORDER — AMPHETAMINE-DEXTROAMPHETAMINE 15 MG PO TABS: 15.0000 mg | ORAL_TABLET | Freq: Two times a day (BID) | ORAL | Status: DC

## 2011-06-02 NOTE — Telephone Encounter (Signed)
Patient needs new rx for adderral. Please call when ready to pick up.

## 2011-06-02 NOTE — Telephone Encounter (Signed)
Left message informing patient rx ready for pick-up and will be signed by Dr.Paz Adriana Hansen is out of the office)

## 2011-07-05 ENCOUNTER — Other Ambulatory Visit: Payer: Self-pay | Admitting: Internal Medicine

## 2011-07-05 DIAGNOSIS — F988 Other specified behavioral and emotional disorders with onset usually occurring in childhood and adolescence: Secondary | ICD-10-CM

## 2011-07-05 MED ORDER — AMPHETAMINE-DEXTROAMPHETAMINE 15 MG PO TABS: 15.0000 mg | ORAL_TABLET | Freq: Two times a day (BID) | ORAL | Status: DC

## 2011-07-05 NOTE — Telephone Encounter (Signed)
Patient aware rx sent  

## 2011-07-29 ENCOUNTER — Telehealth: Payer: Self-pay | Admitting: Internal Medicine

## 2011-07-29 DIAGNOSIS — F988 Other specified behavioral and emotional disorders with onset usually occurring in childhood and adolescence: Secondary | ICD-10-CM

## 2011-07-29 MED ORDER — AMPHETAMINE-DEXTROAMPHETAMINE 15 MG PO TABS: 15.0000 mg | ORAL_TABLET | Freq: Two times a day (BID) | ORAL | Status: DC

## 2011-07-29 NOTE — Telephone Encounter (Signed)
Patient aware rx is ready for pickup. 

## 2011-07-29 NOTE — Telephone Encounter (Signed)
Pt needs refill on generic adderall 15mg . Call when ready for pick up.

## 2011-08-31 ENCOUNTER — Other Ambulatory Visit: Payer: Self-pay | Admitting: Internal Medicine

## 2011-08-31 DIAGNOSIS — F988 Other specified behavioral and emotional disorders with onset usually occurring in childhood and adolescence: Secondary | ICD-10-CM

## 2011-08-31 MED ORDER — AMPHETAMINE-DEXTROAMPHETAMINE 15 MG PO TABS: 15.0000 mg | ORAL_TABLET | Freq: Two times a day (BID) | ORAL | Status: DC

## 2011-08-31 NOTE — Telephone Encounter (Signed)
refill for generic form of Adderall  Last fill 5.2.13 amphetamine-dextroamphetamine (ADDERALL, 15MG ,) 15 MG tablet  Qty 60 Take 1 tablet (15 mg total) by mouth 2 (two) times daily.   Last ov 12-12-1985 Please call when ready for pick up [phone]

## 2011-08-31 NOTE — Telephone Encounter (Signed)
Left message on voicemail informing patient rx is ready for pick-up  

## 2011-09-29 ENCOUNTER — Other Ambulatory Visit: Payer: Self-pay | Admitting: *Deleted

## 2011-09-29 DIAGNOSIS — F988 Other specified behavioral and emotional disorders with onset usually occurring in childhood and adolescence: Secondary | ICD-10-CM

## 2011-09-29 MED ORDER — AMPHETAMINE-DEXTROAMPHETAMINE 15 MG PO TABS: 15.0000 mg | ORAL_TABLET | Freq: Two times a day (BID) | ORAL | Status: DC

## 2011-09-29 NOTE — Telephone Encounter (Signed)
Pt aware Rx ready for pick up 

## 2011-10-29 ENCOUNTER — Telehealth: Payer: Self-pay | Admitting: Internal Medicine

## 2011-10-29 DIAGNOSIS — F988 Other specified behavioral and emotional disorders with onset usually occurring in childhood and adolescence: Secondary | ICD-10-CM

## 2011-10-29 MED ORDER — AMPHETAMINE-DEXTROAMPHETAMINE 15 MG PO TABS: 15.0000 mg | ORAL_TABLET | Freq: Two times a day (BID) | ORAL | Status: DC

## 2011-10-29 NOTE — Telephone Encounter (Signed)
Patient aware rx is ready for pickup. 

## 2011-10-29 NOTE — Telephone Encounter (Signed)
Pt called requesting rx for adderall 15mg .

## 2011-11-24 ENCOUNTER — Telehealth: Payer: Self-pay | Admitting: Internal Medicine

## 2011-11-24 MED ORDER — AMPHETAMINE-DEXTROAMPHETAMINE 15 MG PO TABS: 15.0000 mg | ORAL_TABLET | Freq: Two times a day (BID) | ORAL | Status: DC

## 2011-11-24 NOTE — Telephone Encounter (Signed)
Pt requesting rx for adderall. Call [phone] when ready.

## 2011-11-24 NOTE — Telephone Encounter (Signed)
RX placed at the front for pick-up, patient aware and informed she needs to schedule a CPX with Dr.Hopper

## 2011-12-27 ENCOUNTER — Other Ambulatory Visit: Payer: Self-pay | Admitting: Internal Medicine

## 2011-12-27 MED ORDER — AMPHETAMINE-DEXTROAMPHETAMINE 15 MG PO TABS: 15.0000 mg | ORAL_TABLET | Freq: Two times a day (BID) | ORAL | Status: DC

## 2011-12-27 NOTE — Telephone Encounter (Signed)
refill adderall 15mg  - per call from pt at 951am

## 2011-12-27 NOTE — Telephone Encounter (Signed)
Patient aware rx is ready for pick-up, patient informed to schedule CPX when she picks up rx

## 2012-01-06 ENCOUNTER — Encounter: Payer: Self-pay | Admitting: Internal Medicine

## 2012-01-06 ENCOUNTER — Ambulatory Visit (INDEPENDENT_AMBULATORY_CARE_PROVIDER_SITE_OTHER): Payer: BC Managed Care – PPO | Admitting: Internal Medicine

## 2012-01-06 VITALS — BP 112/70 | HR 102 | Temp 98.1°F | Wt 156.8 lb

## 2012-01-06 DIAGNOSIS — B079 Viral wart, unspecified: Secondary | ICD-10-CM

## 2012-01-06 DIAGNOSIS — F988 Other specified behavioral and emotional disorders with onset usually occurring in childhood and adolescence: Secondary | ICD-10-CM

## 2012-01-06 MED ORDER — AMPHETAMINE-DEXTROAMPHETAMINE 15 MG PO TABS: ORAL_TABLET | ORAL | Status: DC

## 2012-01-06 NOTE — Assessment & Plan Note (Signed)
She is stable with no adverse effects on the medication; med refilled

## 2012-01-06 NOTE — Progress Notes (Signed)
  Subjective:    Patient ID: Adriana Hansen, female    DOB: 10/02/1985, 26 y.o.   MRN: [id number]  HPI   She is here for refill of Adderall; he continues to be an effective medication. The specifics of her ADD was entered into the problem list for future reference  Review of Systems Constitutional: No significant change in weight,  sleep disruption, fatigue, weakness Cardiovascular: No palpitations, racing, irregularity, sweating Gastrointestinal: No nausea or vomiting,constipation, diarrhea Neuro: No tremor, mental status change,headache  Psych: No anxiety, depression, loss of interest, panic attacks,alcohhol or drug abuse Endocrine: No change in skin/hair/nails  She was stung or bitten by an unknown vector of the left and right thighs in 2010. A relative has Lyme disease; she is questioning her risk. She has no symptoms of musculoskeletal, constitutional, or dermatologic nature       Objective:   Physical Exam Gen.: thin  but well-nourished; in no acute distress Eyes: Extraocular motion intact; no lid lag or proptosis Neck: thyroid normal Heart: Normal rhythm and rate without significant murmur, gallop, or extra heart sounds Lungs: Chest clear to auscultation without rales,rales, wheezes Neuro:Deep tendon reflexes are equal and within normal limits; no tremor  Skin: Warm and dry without significant lesions or rashes; no onycholysis. Wart 5th L digit Psych: Normally communicative and interactive; no abnormal mood or affect clinically.         Assessment & Plan:  #1ADD #2 wart Plan: See orders and recommendations

## 2012-01-06 NOTE — Patient Instructions (Addendum)
If you activate My Chart; the results can be released to you as soon as they populate from the lab. If you choose not to use this program; the labs have to be reviewed, copied & mailed   causing a delay in getting the results to you. 

## 2012-02-21 ENCOUNTER — Telehealth: Payer: Self-pay | Admitting: Internal Medicine

## 2012-02-21 MED ORDER — AMPHETAMINE-DEXTROAMPHETAMINE 15 MG PO TABS: ORAL_TABLET | ORAL | Status: DC

## 2012-02-21 NOTE — Telephone Encounter (Signed)
Pt called requesting rx for adderall. Call [phone] when ready for pick up.

## 2012-02-21 NOTE — Telephone Encounter (Signed)
Left message on voicemail informing patient rx ready for pick-up  

## 2012-03-23 ENCOUNTER — Telehealth: Payer: Self-pay | Admitting: Internal Medicine

## 2012-03-23 MED ORDER — AMPHETAMINE-DEXTROAMPHETAMINE 15 MG PO TABS: ORAL_TABLET | ORAL | Status: DC

## 2012-03-23 NOTE — Telephone Encounter (Signed)
Patient called requesting rx for Adderall. Call [phone] when ready for pick up.

## 2012-03-23 NOTE — Telephone Encounter (Signed)
Patient aware rx is ready for pick-up and Controlled Substance Contract to be signed

## 2012-04-19 ENCOUNTER — Telehealth: Payer: Self-pay | Admitting: Internal Medicine

## 2012-04-19 MED ORDER — AMPHETAMINE-DEXTROAMPHETAMINE 15 MG PO TABS: ORAL_TABLET | ORAL | Status: DC

## 2012-04-19 NOTE — Telephone Encounter (Signed)
Patient aware rx is available for pick-up  

## 2012-04-19 NOTE — Telephone Encounter (Signed)
Pt called requesting rx for adderall. CB# [phone]

## 2012-05-18 ENCOUNTER — Other Ambulatory Visit: Payer: Self-pay | Admitting: *Deleted

## 2012-05-18 MED ORDER — AMPHETAMINE-DEXTROAMPHETAMINE 15 MG PO TABS: ORAL_TABLET | ORAL | Status: DC

## 2012-05-18 NOTE — Telephone Encounter (Signed)
Rx ready for pick up., Pt aware. 

## 2012-06-15 ENCOUNTER — Other Ambulatory Visit: Payer: Self-pay | Admitting: *Deleted

## 2012-06-15 MED ORDER — AMBULATORY NON FORMULARY MEDICATION: Status: DC

## 2012-06-15 NOTE — Telephone Encounter (Signed)
Patient called and indicated she is taking generic, rx printed, placed on ledge for sig then to the front desk for pick-up

## 2012-06-15 NOTE — Telephone Encounter (Signed)
Patient called triage requesting refill on Adderall. LAst OV 01/06/2012 with last fill on 05/18/12 #60. Okay to refill?

## 2012-06-15 NOTE — Telephone Encounter (Signed)
Left message on VM for patient to return call with the EXACT name of med she is needing filled. 2 listed on medication list-? Brand name Adderall or generic

## 2012-06-15 NOTE — Telephone Encounter (Signed)
OK #60 

## 2012-07-11 ENCOUNTER — Telehealth: Payer: Self-pay | Admitting: Internal Medicine

## 2012-07-11 MED ORDER — AMBULATORY NON FORMULARY MEDICATION: Status: DC

## 2012-07-11 NOTE — Telephone Encounter (Signed)
PT called to see if they could get a refill on amphetamine. thanks

## 2012-07-11 NOTE — Telephone Encounter (Signed)
RX placed at the front for pick-up, patient aware 

## 2012-07-11 NOTE — Telephone Encounter (Signed)
RX placed on ledge for signature, then patient will be called to pick up

## 2012-08-03 IMAGING — US US OB COMP LESS 14 WK
1 series · 13 of 28 positions shown · non-contrast
Comparison: None.

CLINICAL DATA: Positive pregnancy test, pelvic pain in the right
lower quadrant, vaginal bleeding.  5-week-6-day gestational age by
LMP.

OBSTETRIC <14 WK US AND TRANSVAGINAL OB US
TECHNIQUE: Both transabdominal and transvaginal ultrasound
examinations were performed for complete evaluation of the
gestation as well as the maternal uterus, adnexal regions, and
pelvic cul-de-sac.  Transvaginal technique was performed to assess
early pregnancy.

[Series 1: us ob comp less 14 wks · 48 acquisitions, 13 frames shown]
[im 2/48]
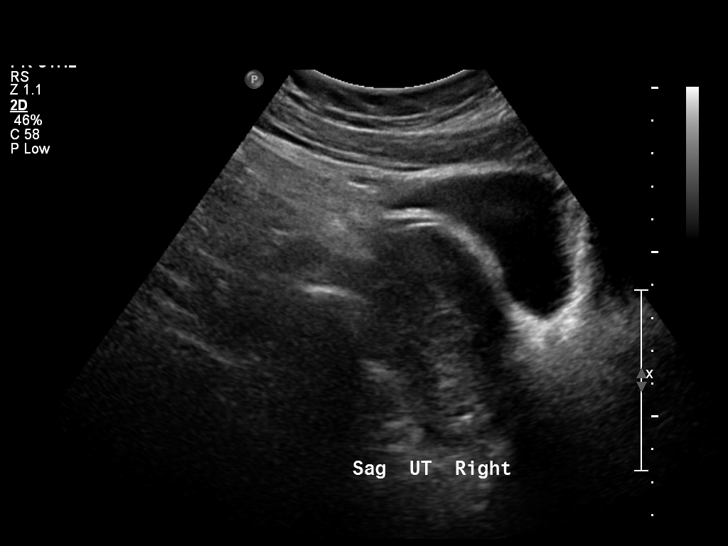
[im 6/48]
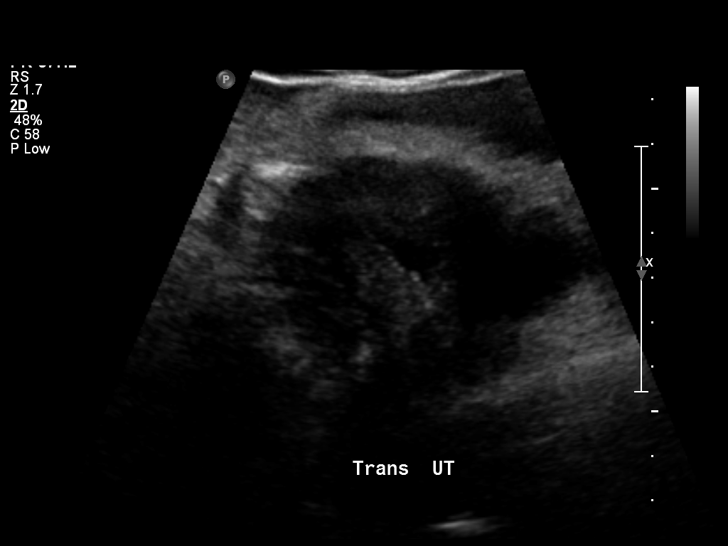
[im 9/48]
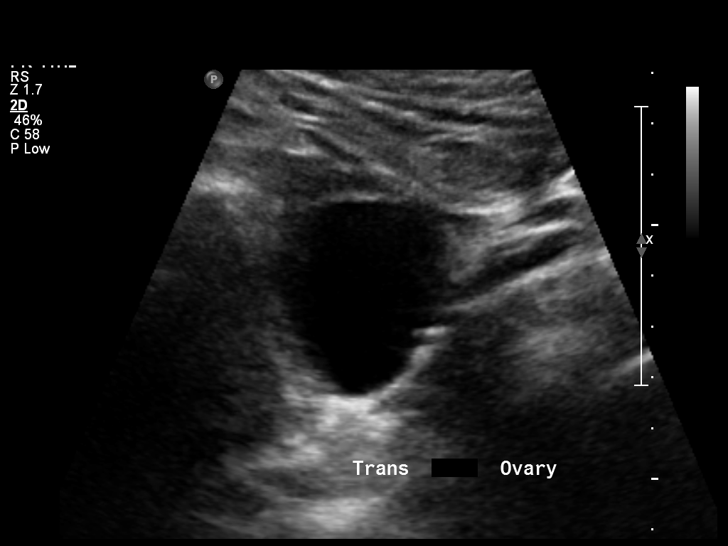
[im 13/48]
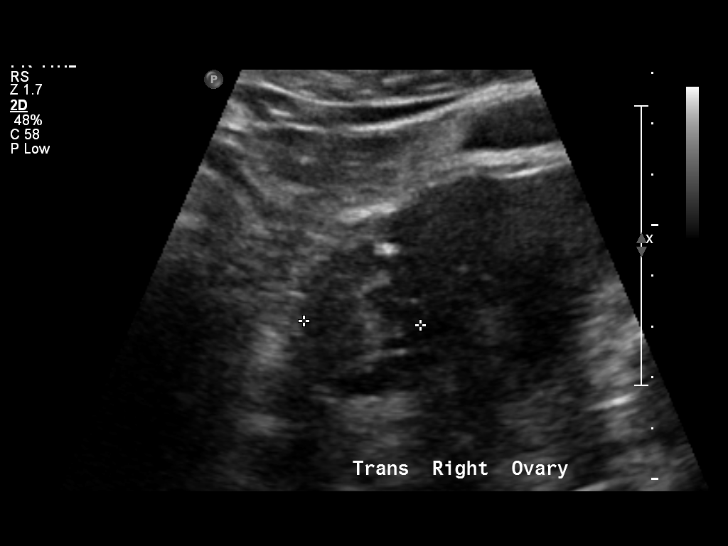
[im 16/48]
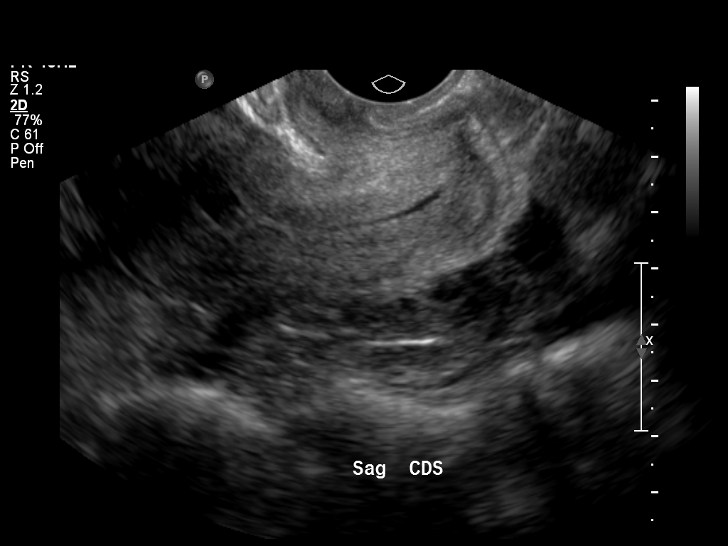
[im 20/48]
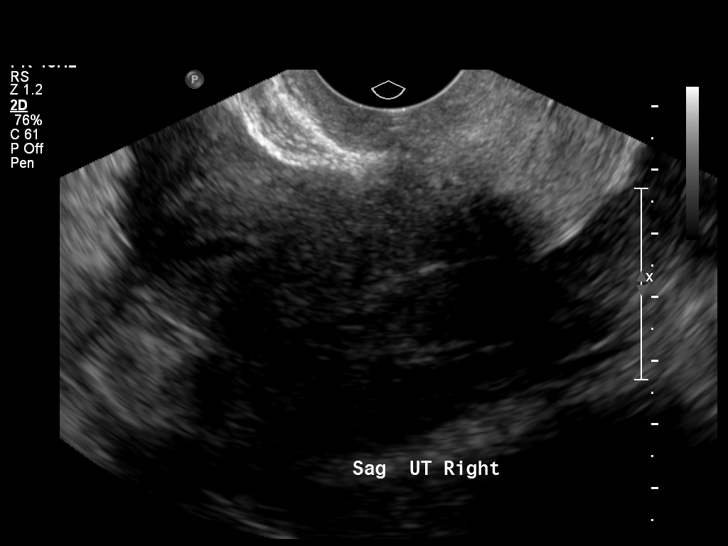
[im 25/48]
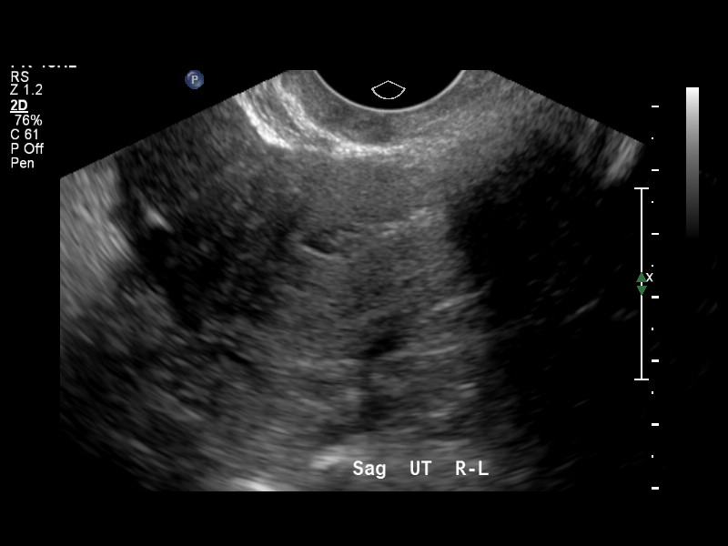
[im 28/48]
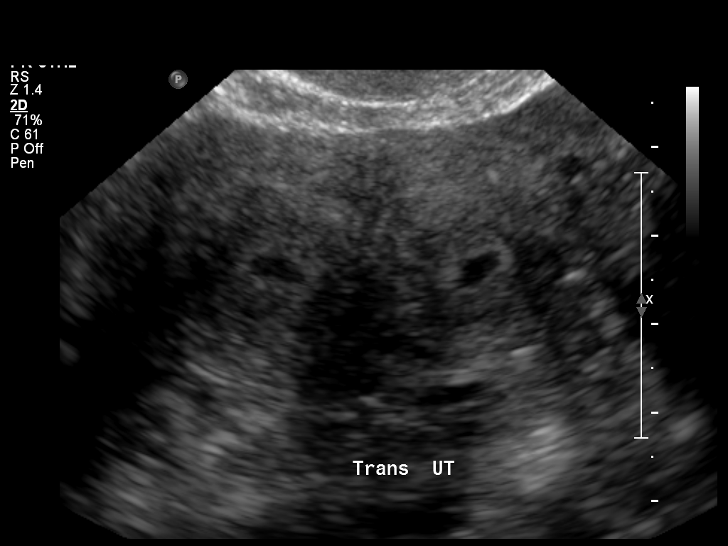
[im 32/48]
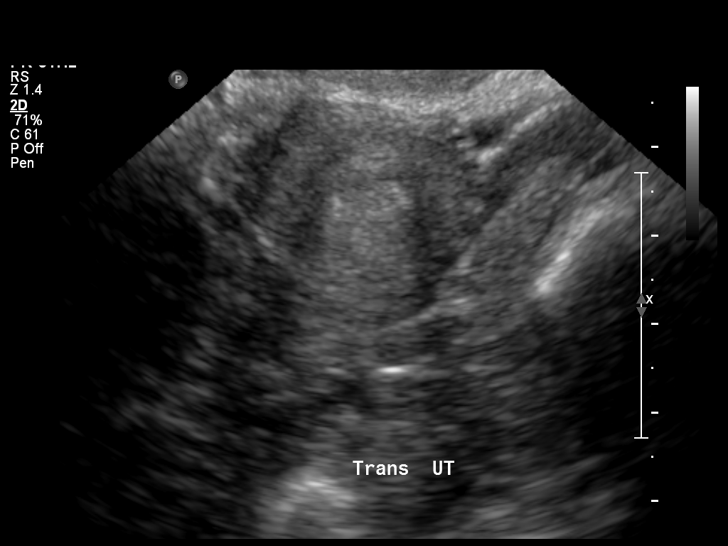
[im 35/48]
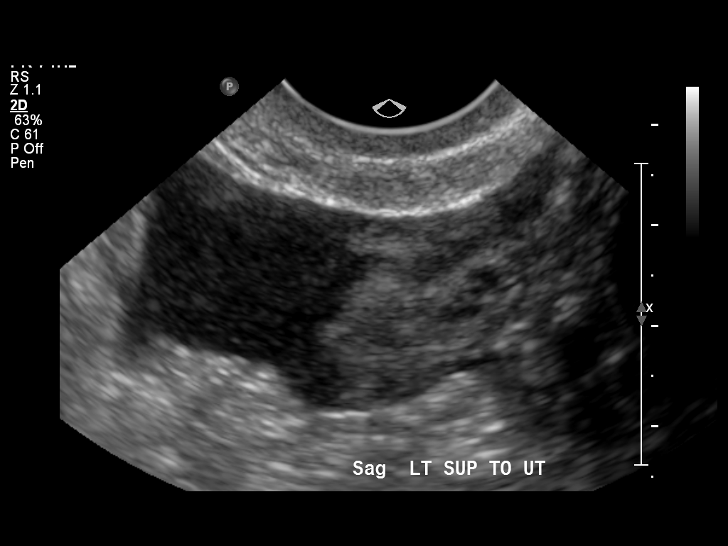
[im 39/48]
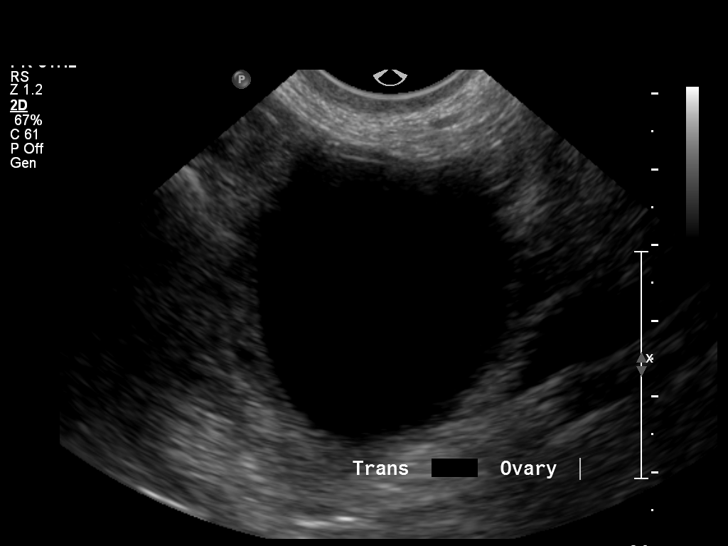
[im 42/48]
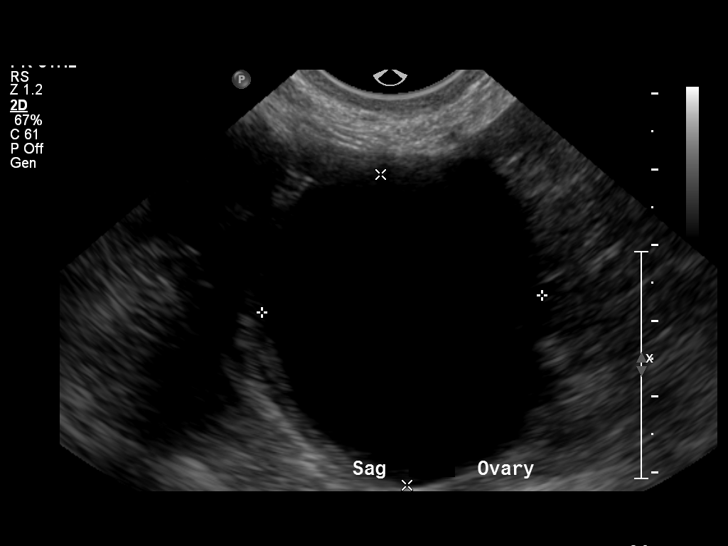
[im 46/48]
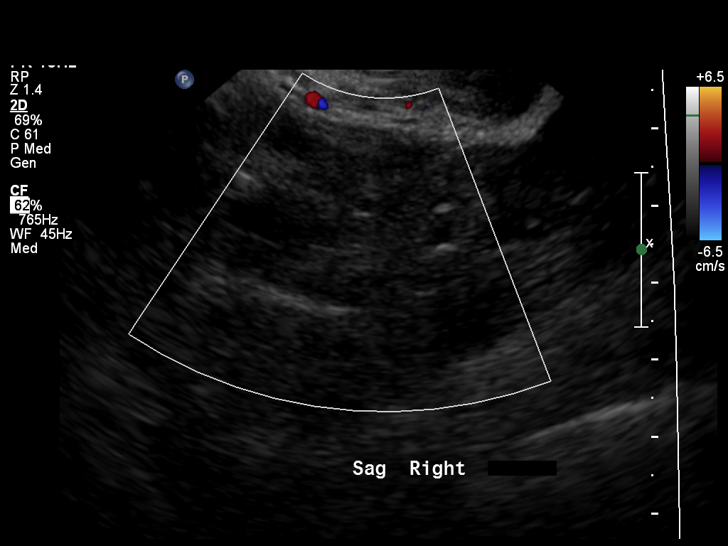

[13 of 28 positions shown; findings below may reference images not displayed]

Intrauterine gestational sac:  Not definitely identified.  Probable
fluid within the endometrial canal.  The endometrium at the uterine
fundus is divergent, with suggestion of bicornuate (less likely
septate) morphology.
Yolk sac: Not visualized
Embryo: Not visualized
Cardiac Activity: Not visualized

Maternal uterus/adnexae:
Left ovarian simple appearing cyst that measures 4.1 x 3.9 x
cm.

In the right adnexa, there is a complex mass like lesion measuring
5.7 x 5.5 x 2.3 cm.  No internal gestational sac is identified
within this mass.

There is a large amount of free fluid within the pelvis, with
internal echoes present.
IMPRESSION: Fluid within the endometrial canal, but no definite gestational sac
identified.  There is an apparent bicornuate (less likely septate)
uterine morphology. At the junction of the uterine horns, there are
echoes present within the area of apparent fluid, which could
represent blood/debris.  An irregular gestational sac is less
likely but possible.

Right adnexal complex mass, without an identifiable right ovary
separate from this mass.  In the absence of a definitive
intrauterine gestational sac, the constellation of findings makes
ectopic pregnancy likely.  Differential considerations also include
tubo-ovarian abscess or hemorrhagic cyst, or intrauterine gestation
too early to be sonographically visible.  The appearance would be
unlikely to represent endometrioma, although the appearance of
endometriosis may be variable.

Simple appearing left ovarian cyst.  Most likely this represents a
corpus luteum or less likely functional cyst.

Depending on the patient's hemodynamic status and other clinical
indicators, alternatives to laparoscopy could include short-term
follow-up ultrasound and correlation with serial quantitative beta
HCG levels.

These results were called by telephone on 10/13/2010  at  [DATE] p.m.
to  Tadaharu Horai, PA, who verbally acknowledged these results.

## 2012-08-14 ENCOUNTER — Telehealth: Payer: Self-pay | Admitting: Internal Medicine

## 2012-08-14 MED ORDER — AMBULATORY NON FORMULARY MEDICATION: Status: DC

## 2012-08-14 NOTE — Telephone Encounter (Signed)
Patient called requesting rx for Amphetamine Salt Combo 15 mg. Call [phone] when ready for pick up.

## 2012-08-15 NOTE — Telephone Encounter (Signed)
Patient aware rx is ready for pickup. 

## 2012-09-07 ENCOUNTER — Ambulatory Visit (INDEPENDENT_AMBULATORY_CARE_PROVIDER_SITE_OTHER): Payer: BC Managed Care – PPO | Admitting: Internal Medicine

## 2012-09-07 ENCOUNTER — Encounter: Payer: Self-pay | Admitting: Internal Medicine

## 2012-09-07 VITALS — BP 120/68 | HR 115 | Temp 98.2°F | Resp 12 | Ht 67.0 in | Wt 170.0 lb

## 2012-09-07 DIAGNOSIS — Z0000 Encounter for general adult medical examination without abnormal findings: Secondary | ICD-10-CM

## 2012-09-07 DIAGNOSIS — F988 Other specified behavioral and emotional disorders with onset usually occurring in childhood and adolescence: Secondary | ICD-10-CM

## 2012-09-07 LAB — HEPATIC FUNCTION PANEL
Albumin: 4.1 g/dL (ref 3.5–5.2)
Total Protein: 6.8 g/dL (ref 6.0–8.3)

## 2012-09-07 LAB — CBC WITH DIFFERENTIAL/PLATELET
Basophils Absolute: 0 10*3/uL (ref 0.0–0.1)
Basophils Relative: 0.2 % (ref 0.0–3.0)
Eosinophils Absolute: 0.1 10*3/uL (ref 0.0–0.7)
Hemoglobin: 13.3 g/dL (ref 12.0–15.0)
Lymphs Abs: 2.5 10*3/uL (ref 0.7–4.0)
MCHC: 33.2 g/dL (ref 30.0–36.0)
MCV: 90.6 fl (ref 78.0–100.0)
Monocytes Absolute: 0.9 10*3/uL (ref 0.1–1.0)
Neutro Abs: 5.5 10*3/uL (ref 1.4–7.7)
RBC: 4.43 Mil/uL (ref 3.87–5.11)
RDW: 13 % (ref 11.5–14.6)

## 2012-09-07 LAB — LIPID PANEL
Cholesterol: 151 mg/dL (ref 0–200)
HDL: 39.5 mg/dL (ref >39.00)
Triglycerides: 100 mg/dL (ref 0.0–149.0)
VLDL: 20 mg/dL (ref 0.0–40.0)

## 2012-09-07 LAB — TSH: TSH: 1.11 u[IU]/mL (ref 0.35–5.50)

## 2012-09-07 LAB — BASIC METABOLIC PANEL
CO2: 25 mEq/L (ref 19–32)
Calcium: 9.2 mg/dL (ref 8.4–10.5)
Chloride: 106 mEq/L (ref 96–112)
Creatinine, Ser: 0.6 mg/dL (ref 0.4–1.2)
Glucose, Bld: 84 mg/dL (ref 70–99)
Sodium: 137 mEq/L (ref 135–145)

## 2012-09-07 MED ORDER — AMBULATORY NON FORMULARY MEDICATION: Status: DC

## 2012-09-07 NOTE — Progress Notes (Signed)
  Subjective:    Patient ID: Adriana Hansen, female    DOB: 1986/03/02, 27 y.o.   MRN: [id number]  HPI  Adriana Hansen is here for a physical;acute issues include ADD which is well controlled w/o adverse effects.     Review of Systems  There is strong FH of  Dyslipidemia.She is on a modified  heart healthy diet; she exercises @ work as walking & lifting 5-6 times per week without symptoms. Specifically she denies chest pain, palpitations, dyspnea, or claudication. Family history is negative for premature coronary disease .      Objective:   Physical Exam Gen.: Healthy and well-nourished in appearance. Alert, appropriate and cooperative throughout exam. Head: Normocephalic without obvious abnormalities  Eyes: No corneal or conjunctival inflammation noted.  Extraocular motion intact. Vision grossly normal without lenses Ears: External  ear exam reveals no significant lesions or deformities. Canals clear .TMs normal. Hearing is grossly normal bilaterally. Nose: External nasal exam reveals no deformity or inflammation. Nasal mucosa are pink and moist. No lesions or exudates noted.  Mouth: Oral mucosa and oropharynx reveal no lesions or exudates. Teeth in good repair. Neck: No deformities, masses, or tenderness noted. Range of motion & Thyroid normal. Lungs: Normal respiratory effort; chest expands symmetrically. Lungs are clear to auscultation without rales, wheezes, or increased work of breathing. Heart: Normal rate and rhythm. Normal S1 and S2. No gallop, click, or rub. S4 w/o murmur. Abdomen: Bowel sounds normal; abdomen soft and nontender. No masses, organomegaly or hernias noted. Genitalia: As per Gyn                                  Musculoskeletal/extremities: No deformity or scoliosis noted of  the thoracic or lumbar spine.  No clubbing, cyanosis, edema, or significant extremity  deformity noted. Range of motion normal .Tone & strength  Normal. Joints normal . Nail health good. Minor crepitus L  knee Able to lie down & sit up w/o help. Negative SLR bilaterally past 90 degrees Vascular: Carotid, radial artery, dorsalis pedis and  posterior tibial pulses are full and equal. No bruits present. Neurologic: Alert and oriented x3. Deep tendon reflexes symmetrical and normal.         Skin: Intact without suspicious lesions or rashes. Lymph: No cervical, axillary lymphadenopathy present. Psych: Mood and affect are normal. Normally interactive                                                                                       Assessment & Plan:  #1 comprehensive physical exam; no acute findings  Plan: see Orders  & Recommendations

## 2012-09-07 NOTE — Patient Instructions (Addendum)
Please think about quitting smoking. Review the risks we discussed. Please call 1-800-QUIT-NOW ([phone]) for free smoking cessation counseling.  If you activate the  My Chart system; lab & Xray results will be released directly  to you as soon as I review & address these through the computer. If you choose not to sign up for My Chart within 36 hours of labs being drawn; results will be reviewed & interpretation added before being copied & mailed, causing a delay in getting the results to you.If you do not receive that report within 7-10 days ,please call. Additionally you can use this system to gain direct  access to your records  if  out of town or @ an office of a  physician who is not in  the My Chart network.  This improves continuity of care & places you in control of your medical record.

## 2012-09-12 ENCOUNTER — Encounter: Payer: Self-pay | Admitting: *Deleted

## 2012-12-06 ENCOUNTER — Telehealth: Payer: Self-pay | Admitting: Internal Medicine

## 2012-12-06 DIAGNOSIS — F988 Other specified behavioral and emotional disorders with onset usually occurring in childhood and adolescence: Secondary | ICD-10-CM

## 2012-12-06 NOTE — Telephone Encounter (Signed)
Patient called requesting new rx for Adderall. Call [phone] when ready for pick up.

## 2012-12-08 MED ORDER — AMPHETAMINE-DEXTROAMPHETAMINE 15 MG PO TABS: 15.0000 mg | ORAL_TABLET | Freq: Every day | ORAL | Status: DC

## 2012-12-08 NOTE — Telephone Encounter (Signed)
Med filled. Pt picked up.

## 2012-12-08 NOTE — Telephone Encounter (Signed)
OK X1 

## 2012-12-08 NOTE — Telephone Encounter (Signed)
Pt has called office today regarding the refill.  Last OV 09-07-12 Med last filled 09-07-12 #60 with 0 refills   Controlled Substance Contract on file.   No UDS report.

## 2012-12-11 ENCOUNTER — Telehealth: Payer: Self-pay | Admitting: Internal Medicine

## 2012-12-11 DIAGNOSIS — F988 Other specified behavioral and emotional disorders with onset usually occurring in childhood and adolescence: Secondary | ICD-10-CM

## 2012-12-11 NOTE — Telephone Encounter (Signed)
Patient called stating we wrote her rx for Adderall incorrectly. She takes 1 tablet twice daily and we wrote it for 1 daily. We wrote her 3 months worth. She got the first one filled, but still has the paper copies for months 2 and 3. She would like to know what she should do.

## 2012-12-14 ENCOUNTER — Other Ambulatory Visit: Payer: Self-pay | Admitting: *Deleted

## 2012-12-14 MED ORDER — AMPHETAMINE-DEXTROAMPHETAMINE 15 MG PO TABS: 15.0000 mg | ORAL_TABLET | Freq: Two times a day (BID) | ORAL | Status: DC

## 2012-12-14 NOTE — Telephone Encounter (Signed)
Please advise on medication request

## 2012-12-14 NOTE — Telephone Encounter (Signed)
Original scripts were incorrect, pt takes 15mg  BID not once daily. Pt to return old script for new ones.

## 2012-12-21 ENCOUNTER — Telehealth: Payer: Self-pay | Admitting: *Deleted

## 2012-12-21 DIAGNOSIS — F988 Other specified behavioral and emotional disorders with onset usually occurring in childhood and adolescence: Secondary | ICD-10-CM

## 2012-12-21 MED ORDER — AMPHETAMINE-DEXTROAMPHETAMINE 15 MG PO TABS: 15.0000 mg | ORAL_TABLET | Freq: Two times a day (BID) | ORAL | Status: DC

## 2012-12-21 NOTE — Telephone Encounter (Signed)
Pt come in to let us know her rx for Adderall 15mg  was written with the wrong directions and before she realized it she had filled it.  She should be taking 15mg  bid, so she will be out before she can fill her other rx.which she can not fill until 01-07-13.  Pt is requesting a rx for 15mg  bid #30 which will cover her until she can fill the next rx..   Informed Dr. Alwyn Ren of this and he okayed the new rx.  Printed the rx and put it up front for the pt to pick-up.//AB/CMA

## 2013-03-07 ENCOUNTER — Other Ambulatory Visit: Payer: Self-pay | Admitting: *Deleted

## 2013-03-07 ENCOUNTER — Telehealth: Payer: Self-pay | Admitting: *Deleted

## 2013-03-07 DIAGNOSIS — F988 Other specified behavioral and emotional disorders with onset usually occurring in childhood and adolescence: Secondary | ICD-10-CM

## 2013-03-07 MED ORDER — AMPHETAMINE-DEXTROAMPHETAMINE 15 MG PO TABS: 15.0000 mg | ORAL_TABLET | Freq: Two times a day (BID) | ORAL | Status: DC

## 2013-03-07 NOTE — Telephone Encounter (Signed)
Patient is requesting a refill for Adderall.  Last seen-09/07/2012  Last filled-12/21/2012  UDS-not on file, contract signed   Please advise. SW

## 2013-03-07 NOTE — Telephone Encounter (Signed)
Adderall refilled per protocol

## 2013-03-07 NOTE — Telephone Encounter (Signed)
Called and spoke with patient informing her that Adderall script is ready for pick up at our front desk. UDS needed as well. JG//CMA

## 2013-03-07 NOTE — Telephone Encounter (Signed)
OK X1 

## 2013-04-04 ENCOUNTER — Other Ambulatory Visit: Payer: Self-pay | Admitting: *Deleted

## 2013-04-04 ENCOUNTER — Telehealth: Payer: Self-pay | Admitting: *Deleted

## 2013-04-04 DIAGNOSIS — F988 Other specified behavioral and emotional disorders with onset usually occurring in childhood and adolescence: Secondary | ICD-10-CM

## 2013-04-04 MED ORDER — AMPHETAMINE-DEXTROAMPHETAMINE 15 MG PO TABS: 15.0000 mg | ORAL_TABLET | Freq: Two times a day (BID) | ORAL | Status: DC

## 2013-04-04 NOTE — Telephone Encounter (Signed)
Rx for adderall printed and placed on ledge for signature.

## 2013-04-04 NOTE — Telephone Encounter (Signed)
Patient is requesting a refill on Adderall. Last office visit 09/07/12 Last filled 03/07/13 #60 No agreement or UDS on file. Okay to refill?

## 2013-04-04 NOTE — Telephone Encounter (Signed)
OK X1 

## 2013-04-05 NOTE — Telephone Encounter (Signed)
Informed pt Rx for Adderall is ready for pick-up and it will be put up front.//AB/CMA

## 2013-04-30 ENCOUNTER — Encounter: Payer: Self-pay | Admitting: Internal Medicine

## 2013-05-07 ENCOUNTER — Telehealth: Payer: Self-pay | Admitting: *Deleted

## 2013-05-07 DIAGNOSIS — F988 Other specified behavioral and emotional disorders with onset usually occurring in childhood and adolescence: Secondary | ICD-10-CM

## 2013-05-07 NOTE — Telephone Encounter (Signed)
Ok X1 

## 2013-05-07 NOTE — Telephone Encounter (Signed)
amphetamine-dextroamphetamine (ADDERALL) 15 MG tablet Last refill: 04/04/13 #60, 0 refills Last OV: 09/07/12 UDS up-to-date, low risk

## 2013-05-09 MED ORDER — AMPHETAMINE-DEXTROAMPHETAMINE 15 MG PO TABS: 15.0000 mg | ORAL_TABLET | Freq: Two times a day (BID) | ORAL | Status: DC

## 2013-05-09 NOTE — Addendum Note (Signed)
Addended by: Verdie ShireBAYNES, Nakeisha Greenhouse M on: 05/09/2013 10:30 AM   Modules accepted: Orders

## 2013-05-09 NOTE — Telephone Encounter (Signed)
LMOM (10:06am) informing the pt that her med refill request is ready to be picked up, and will leave it up front.//AB/CMA

## 2013-06-05 ENCOUNTER — Telehealth: Payer: Self-pay | Admitting: *Deleted

## 2013-06-05 DIAGNOSIS — F988 Other specified behavioral and emotional disorders with onset usually occurring in childhood and adolescence: Secondary | ICD-10-CM

## 2013-06-05 MED ORDER — AMPHETAMINE-DEXTROAMPHETAMINE 15 MG PO TABS: 15.0000 mg | ORAL_TABLET | Freq: Two times a day (BID) | ORAL | Status: DC

## 2013-06-05 NOTE — Telephone Encounter (Signed)
Patient phoned requesting refill on adderall.  Last OV with PCP 09/07/12 and med last ordered 05/09/13.  CB# [phone]

## 2013-06-05 NOTE — Telephone Encounter (Signed)
OK X1 

## 2013-06-05 NOTE — Telephone Encounter (Signed)
Script printed, awaiting MD signature, and patient notified script would be available for p/u.

## 2013-07-02 ENCOUNTER — Telehealth: Payer: Self-pay | Admitting: *Deleted

## 2013-07-02 DIAGNOSIS — F988 Other specified behavioral and emotional disorders with onset usually occurring in childhood and adolescence: Secondary | ICD-10-CM

## 2013-07-02 MED ORDER — AMPHETAMINE-DEXTROAMPHETAMINE 15 MG PO TABS: 15.0000 mg | ORAL_TABLET | Freq: Two times a day (BID) | ORAL | Status: DC

## 2013-07-02 NOTE — Telephone Encounter (Signed)
Patient notified script can be picked up

## 2013-07-02 NOTE — Telephone Encounter (Signed)
rx printed waiting for signature 

## 2013-07-02 NOTE — Telephone Encounter (Signed)
Pt called requesting Adderall refill.  Please advise 

## 2013-07-02 NOTE — Telephone Encounter (Signed)
OK X1 

## 2013-07-30 ENCOUNTER — Telehealth: Payer: Self-pay | Admitting: *Deleted

## 2013-07-30 DIAGNOSIS — F988 Other specified behavioral and emotional disorders with onset usually occurring in childhood and adolescence: Secondary | ICD-10-CM

## 2013-07-30 NOTE — Telephone Encounter (Signed)
OK # 30 ; OV for additional refills

## 2013-07-30 NOTE — Telephone Encounter (Signed)
Pt called requesting Adderall refill.  Please advise 

## 2013-07-31 MED ORDER — AMPHETAMINE-DEXTROAMPHETAMINE 15 MG PO TABS: 15.0000 mg | ORAL_TABLET | Freq: Two times a day (BID) | ORAL | Status: DC

## 2013-07-31 NOTE — Telephone Encounter (Signed)
Advised Rx ready. 

## 2013-08-30 ENCOUNTER — Telehealth: Payer: Self-pay | Admitting: *Deleted

## 2013-08-30 DIAGNOSIS — F988 Other specified behavioral and emotional disorders with onset usually occurring in childhood and adolescence: Secondary | ICD-10-CM

## 2013-08-30 MED ORDER — AMPHETAMINE-DEXTROAMPHETAMINE 15 MG PO TABS: 15.0000 mg | ORAL_TABLET | Freq: Two times a day (BID) | ORAL | Status: DC

## 2013-08-30 NOTE — Telephone Encounter (Signed)
Last OV 6/14 RX 1 Needs appt for any more refills

## 2013-08-30 NOTE — Telephone Encounter (Signed)
Pt aware Rx ready 

## 2013-08-30 NOTE — Telephone Encounter (Signed)
Pt called requesting Adderall Rx. Last refill 5.4.15. Please advise

## 2013-09-10 ENCOUNTER — Encounter: Payer: Self-pay | Admitting: Internal Medicine

## 2013-09-10 ENCOUNTER — Ambulatory Visit (INDEPENDENT_AMBULATORY_CARE_PROVIDER_SITE_OTHER): Payer: BC Managed Care – PPO | Admitting: Internal Medicine

## 2013-09-10 VITALS — BP 120/78 | HR 97 | Temp 98.2°F | Ht 66.0 in | Wt 183.2 lb

## 2013-09-10 DIAGNOSIS — F988 Other specified behavioral and emotional disorders with onset usually occurring in childhood and adolescence: Secondary | ICD-10-CM

## 2013-09-10 DIAGNOSIS — Z0000 Encounter for general adult medical examination without abnormal findings: Secondary | ICD-10-CM

## 2013-09-10 MED ORDER — AMPHETAMINE-DEXTROAMPHETAMINE 15 MG PO TABS: 15.0000 mg | ORAL_TABLET | Freq: Two times a day (BID) | ORAL | Status: DC

## 2013-09-10 NOTE — Patient Instructions (Signed)
Your next office appointment will be determined based upon review of your pending labs. Those instructions will be transmitted to you through My Chart  OR  by mail;whichever process is your choice to receive results & recommendations .   HDL or good cholesterol goal  is greater than than 50 in women. Interventions to raise HDL or GOOD cholesterol include: exercising 30-45 minutes 3-4 X per week; including salmon & tuna in the diet;  & supplementing with Omega 3 fatty acids (Flax or Fish oil )  1-2 grams per day.   Cardiovascular exercise, this can be as simple a program as walking, is recommended 30-45 minutes 3-4 times per week. If you're not exercising you should take 6-8 weeks to build up to this level. The best exercises for the low back include freestyle swimming, stretch aerobics, and yoga.Cybex & Nautilus machines rather than dead weights are better for the back.  Please think about quitting smoking. Review the risks we discussed. Please call 1-800-QUIT-NOW ([phone]) for free smoking cessation counseling.

## 2013-09-10 NOTE — Progress Notes (Signed)
   Subjective:    Patient ID: Adriana Hansen, female    DOB: 05-08-1985, 28 y.o.   MRN: [id number]  HPI She is here for a physical;acute issues denied.  She is also here for refill of Adderall; it still is effective in controlling her ADD symptoms. She denies any adverse effects.  At this time she is smoking one half pack per day.  She is physically active at work but has no regular exercise program.  She has a strong family history of lipidemia. Her lipids were very good except for mildly reduced HDL at 39.5. Review of Systems  Pertinent negative or absent signs and symptoms are as follows: Constitutional:Significant increase in weight of 15 #. No significant fatigue; no sleep disorder; no change in appetite. Eye: no blurred, double ,loss of vision Cardiovascular: no palpitations; racing; irregularity ENT/GI: no constipation; diarrhea;hoarseness;dysphagia Derm: no change in nails,hair,skin Neuro: no numbness or tingling; tremor Psych:no anxiety; depression; panic attacks Endo: no temperature intolerance to heat ,cold       Objective:   Physical Exam Gen.: Healthy and well-nourished in appearance. Alert, appropriate and cooperative throughout exam. Appears younger than stated age  Head: Normocephalic without obvious abnormalities Eyes: No corneal or conjunctival inflammation noted. Pupils equal round reactive to light and accommodation. Extraocular motion intact. Ears: External  ear exam reveals no significant lesions or deformities. Canals clear .TMs normal. Hearing is grossly normal bilaterally. Nose: External nasal exam reveals no deformity or inflammation. Nasal mucosa are pink and moist. No lesions or exudates noted.   Mouth: Oral mucosa and oropharynx reveal no lesions or exudates. Teeth in good repair. Neck: No deformities, masses, or tenderness noted. Range of motion & Thyroid normal Lungs: Normal respiratory effort; chest expands symmetrically. Lungs are clear to  auscultation without rales, wheezes, or increased work of breathing. Heart: Normal rate and rhythm. Normal S1 and S2. No gallop, click, or rub.No murmur. Abdomen: Bowel sounds normal; abdomen soft and nontender. No masses, organomegaly or hernias noted. Genitalia: as per Gyn                                  Musculoskeletal/extremities:   Reverse curvature of upper thoracic spine. No clubbing, cyanosis, edema, or significant extremity  deformity noted. Range of motion normal .Tone & strength normal. Hand joints normal   Fingernail / toenail health good. Able to lie down & sit up w/o help. Negative SLR bilaterally Vascular: Carotid, radial artery, dorsalis pedis and  posterior tibial pulses are full and equal. No bruits present. Neurologic: Alert and oriented x3. Deep tendon reflexes symmetrical and normal.  Gait normal         Skin: Intact without suspicious lesions or rashes. Lymph: No cervical, axillary lymphadenopathy present. Psych: Mood and affect are normal. Normally interactive                                                                                        Assessment & Plan:  #1 comprehensive physical exam; no acute findings  Plan: see Orders  & Recommendations

## 2013-09-10 NOTE — Progress Notes (Signed)
Pre visit review using our clinic review tool, if applicable. No additional management support is needed unless otherwise documented below in the visit note. 

## 2013-09-11 ENCOUNTER — Telehealth: Payer: Self-pay | Admitting: Internal Medicine

## 2013-09-11 NOTE — Telephone Encounter (Signed)
Relevant patient education mailed to patient.  

## 2013-10-30 ENCOUNTER — Telehealth: Payer: Self-pay | Admitting: Internal Medicine

## 2013-10-30 DIAGNOSIS — F988 Other specified behavioral and emotional disorders with onset usually occurring in childhood and adolescence: Secondary | ICD-10-CM

## 2013-10-30 MED ORDER — AMPHETAMINE-DEXTROAMPHETAMINE 15 MG PO TABS: 15.0000 mg | ORAL_TABLET | Freq: Two times a day (BID) | ORAL | Status: DC

## 2013-10-30 NOTE — Telephone Encounter (Signed)
OK X1 

## 2013-10-30 NOTE — Telephone Encounter (Signed)
Patient advised via voicemail script can be picked up

## 2013-10-30 NOTE — Telephone Encounter (Signed)
Patient is calling to request a refill on her Adderall medication. Please advise.

## 2013-11-29 ENCOUNTER — Telehealth: Payer: Self-pay | Admitting: Internal Medicine

## 2013-11-29 DIAGNOSIS — F988 Other specified behavioral and emotional disorders with onset usually occurring in childhood and adolescence: Secondary | ICD-10-CM

## 2013-11-29 MED ORDER — AMPHETAMINE-DEXTROAMPHETAMINE 15 MG PO TABS: 15.0000 mg | ORAL_TABLET | Freq: Two times a day (BID) | ORAL | Status: DC

## 2013-11-29 NOTE — Telephone Encounter (Signed)
Called pt no answer LMOM rx's ready for refills...Adriana Hansen

## 2013-11-29 NOTE — Telephone Encounter (Signed)
Patient is calling to request a refill on her Adderall medication. Please advise.   Thank you!

## 2013-11-29 NOTE — Telephone Encounter (Signed)
OK X 1 with 2 post dated Rxs

## 2014-01-28 ENCOUNTER — Encounter: Payer: Self-pay | Admitting: Internal Medicine

## 2014-02-26 ENCOUNTER — Encounter: Payer: Self-pay | Admitting: Internal Medicine

## 2014-02-26 ENCOUNTER — Ambulatory Visit (INDEPENDENT_AMBULATORY_CARE_PROVIDER_SITE_OTHER): Payer: BC Managed Care – PPO | Admitting: Internal Medicine

## 2014-02-26 VITALS — BP 132/92 | HR 105 | Temp 98.5°F | Wt 181.1 lb

## 2014-02-26 DIAGNOSIS — F909 Attention-deficit hyperactivity disorder, unspecified type: Secondary | ICD-10-CM

## 2014-02-26 DIAGNOSIS — F988 Other specified behavioral and emotional disorders with onset usually occurring in childhood and adolescence: Secondary | ICD-10-CM

## 2014-02-26 MED ORDER — AMPHETAMINE-DEXTROAMPHETAMINE 15 MG PO TABS: 15.0000 mg | ORAL_TABLET | Freq: Two times a day (BID) | ORAL | Status: DC

## 2014-02-26 NOTE — Progress Notes (Signed)
   Subjective:    Patient ID: Adriana Hansen, female    DOB: [birth year], 28 y.o.   MRN: [id number]  HPI  She is here for refill of Adderall. She has been officially tested and documented has having ADD  She has been compliant with the medicine; in fact she has been taking it every day.  She states when she does not take it she simply "lays around and eats". She describes this as a profound lack of motivation  With the medication she feels she is significantly more focused with ability to complete tasks.  She denies any adverse effects from the medication.    Review of Systems   Specifically she denies any sleep disruption, appetite disturbance, palpitations, tremor, diarrhea, or anxiety.     Objective:   Physical Exam  Gen.:  well-nourished; in no acute distress Eyes: Extraocular motion intact; no lid lag or proptosis ,nystagmus Neck: full ROM; no masses ; thyroid normal  Heart: Normal rhythm and rate without significant murmur, gallop, or extra heart sounds Lungs: Chest clear to auscultation without rales,rales, wheezes Neuro:Deep tendon reflexes are equal but 0-1/2+ @ knees; no tremor  Skin: Warm and dry without significant lesions or rashes; no onycholysis Lymphatic: no cervical or axillary LA Psych: Normally communicative and interactive; no abnormal mood or affect clinically.           Assessment & Plan:  #1 ADD, therapy effective w/o adverse effects Refill X 3 mos

## 2014-02-26 NOTE — Progress Notes (Signed)
Pre visit review using our clinic review tool, if applicable. No additional management support is needed unless otherwise documented below in the visit note. 

## 2014-02-26 NOTE — Patient Instructions (Signed)
Cardiovascular exercise, this can be as simple a program as walking, is recommended 30-45 minutes 3-4 times per week. If you're not exercising you should take 6-8 weeks to build up to this level.   Eat a low-fat diet with lots of fruits and vegetables, up to 7-9 servings per day. Consume less than 30 Grams (preferably ZERO) of sugar per day from foods & drinks with High Fructose Corn Syrup (HFCS) sugar as #1,2,3 or # 4 on label.[employer], [employer] & [employer] do not carry products with HFCS.

## 2014-04-02 ENCOUNTER — Telehealth: Payer: Self-pay | Admitting: Internal Medicine

## 2014-04-02 NOTE — Telephone Encounter (Signed)
Prior authorization is required on Adderall. This form has been completed via cover my meds. Waiting on insurance response.

## 2014-04-02 NOTE — Telephone Encounter (Signed)
Pt came by office to update her new insurance. Pt states that she has been having issues since she changed her insurance with getting her ADDERALL. Pt states the insurance company sent a faxed request for this authorization. Pt states the insurance needs a statement in order to approve the refill request. Please contact pt when request is reviewed.

## 2014-04-03 NOTE — Telephone Encounter (Signed)
What are formulary alternatives on her plan; her Pharmacist will know

## 2014-04-03 NOTE — Telephone Encounter (Addendum)
Received a fax back from patient's insurance [employer] stating Adderall has been denied due to not meeting conditions necessary for coverage. Pharmacy has been notified. Denial information has been placed in Dr [employer] Lines[employer]pper's mail box.

## 2014-04-03 NOTE — Telephone Encounter (Signed)
Pt called and given info about denial, what is her alternative for adderall? Pls let pt know, pt is out of medication X5 days,  [phone].

## 2014-04-04 NOTE — Telephone Encounter (Signed)
Phone call to patient to let her know to check with the pharmacist or for her to contact her insurance directly to see what alternatives are covered. Will wait to hear back from the patient. She states for this month on the Adderall she paid out of pocket.

## 2014-05-31 ENCOUNTER — Telehealth: Payer: Self-pay | Admitting: Internal Medicine

## 2014-05-31 MED ORDER — AMPHETAMINE-DEXTROAMPHETAMINE 15 MG PO TABS: 15.0000 mg | ORAL_TABLET | Freq: Two times a day (BID) | ORAL | Status: DC

## 2014-05-31 NOTE — Telephone Encounter (Signed)
Pt called in requesting refill on her amphetamine-dextroamphetamine (ADDERALL) 15 MG tablet [[id number]][[id number]]   She said that her ins is not going to cover it but it has been working for her so she is going to pay out of pocket

## 2014-05-31 NOTE — Telephone Encounter (Signed)
OK  Ask what other options they gave her as to meds Ask her to check independent pharmacies [facility]  Often can negotiate there

## 2014-05-31 NOTE — Telephone Encounter (Signed)
I spoke to patient. This is the only script that works good for her. She will check with independent pharmacies. I let her know script is at the front desk.

## 2014-07-04 ENCOUNTER — Telehealth: Payer: Self-pay | Admitting: Internal Medicine

## 2014-07-04 MED ORDER — AMPHETAMINE-DEXTROAMPHETAMINE 15 MG PO TABS: 15.0000 mg | ORAL_TABLET | Freq: Two times a day (BID) | ORAL | Status: DC

## 2014-07-04 NOTE — Telephone Encounter (Signed)
Pt called in for refill on amphetamine-dextroamphetamine (ADDERALL) 15 MG tablet [[id number]][[id number]]

## 2014-07-04 NOTE — Telephone Encounter (Signed)
Notified pt rx ready for pick-up.../lmb 

## 2014-07-04 NOTE — Telephone Encounter (Signed)
OK 

## 2014-08-01 ENCOUNTER — Other Ambulatory Visit: Payer: Self-pay | Admitting: Internal Medicine

## 2014-08-01 MED ORDER — AMPHETAMINE-DEXTROAMPHETAMINE 15 MG PO TABS: 15.0000 mg | ORAL_TABLET | Freq: Two times a day (BID) | ORAL | Status: DC

## 2014-08-01 NOTE — Telephone Encounter (Signed)
Script is ready at the front desk. Notified patient via voicemail.

## 2014-08-01 NOTE — Telephone Encounter (Signed)
Pt called needing refill on amphetamine-dextroamphetamine (ADDERALL) 15 MG tablet [[id number]][[id number]]

## 2014-08-01 NOTE — Telephone Encounter (Signed)
OK mX 1

## 2014-08-05 ENCOUNTER — Ambulatory Visit (INDEPENDENT_AMBULATORY_CARE_PROVIDER_SITE_OTHER): Payer: 59 | Admitting: Internal Medicine

## 2014-08-05 ENCOUNTER — Encounter: Payer: Self-pay | Admitting: Internal Medicine

## 2014-08-05 ENCOUNTER — Other Ambulatory Visit (INDEPENDENT_AMBULATORY_CARE_PROVIDER_SITE_OTHER): Payer: 59

## 2014-08-05 VITALS — BP 134/82 | HR 93 | Temp 98.2°F | Ht 66.0 in | Wt 189.8 lb

## 2014-08-05 DIAGNOSIS — F909 Attention-deficit hyperactivity disorder, unspecified type: Secondary | ICD-10-CM

## 2014-08-05 DIAGNOSIS — F988 Other specified behavioral and emotional disorders with onset usually occurring in childhood and adolescence: Secondary | ICD-10-CM

## 2014-08-05 LAB — TSH: TSH: 1.68 u[IU]/mL (ref 0.35–4.50)

## 2014-08-05 MED ORDER — AMPHETAMINE-DEXTROAMPHETAMINE 15 MG PO TABS: 15.0000 mg | ORAL_TABLET | Freq: Two times a day (BID) | ORAL | Status: DC

## 2014-08-05 MED ORDER — MOMETASONE FUROATE 0.1 % EX OINT: TOPICAL_OINTMENT | Freq: Two times a day (BID) | CUTANEOUS | Status: DC

## 2014-08-05 NOTE — Progress Notes (Signed)
   Subjective:    Patient ID: Adriana Hansen, female    DOB: 10-11-1985, 29 y.o.   MRN: [id number]  HPI She has been compliant with the generic Adderall with significant response and with an absence of adverse effects.  When the branded form was denied; she was out of her medication for a while. 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 Also off the medicine she describes her self as lethargic and as binge eating.  Recently she was treated for poison ivy with a shot of steroids. After 2 weeks she still has lesions on 1 foot. She has no extrinsic symptoms.   Review of Systems  Negative symptoms & signs: No weight gain / loss ,  sleep disruption, fatigue, weakness No heart racing or skipping, abnormal sweating No nausea or vomiting,loss of appetite,constipation, diarrhea No tremor, mental status change (hyperactive or lethargy),headache  No anxiety, depression, loss of interest, panic attacks, excess alcohol use No change in skin/hair/nails      Objective:   Physical Exam  Gen.:  Adequately nourished; in no acute distress Eyes: Extraocular motion intact; no lid lag , proptosis , or nystagmus Neck: full ROM; no masses ; thyroid normal  Heart: Normal rhythm and rate without significant murmur, gallop, or extra heart sounds Lungs: Chest clear to auscultation without rales,rales, wheezes Neuro:Deep tendon reflexes are equal and within normal limits; no tremor  Skin/Nails: Warm and dry without significant lesions or rashes; no onycholysis Lymphatic: no cervical or axillary LA Psych: Normally communicative and interactive; no abnormal mood or affect clinically.         Assessment & Plan:  #1ADD; excellent response medications without adverse effect  #2 rhus dermatitis  Plan: The medication will be renewed for 3 months.  She'll be Rxed a topical agent for the localized poison ivy rash

## 2014-08-05 NOTE — Progress Notes (Signed)
Pre visit review using our clinic review tool, if applicable. No additional management support is needed unless otherwise documented below in the visit note. 

## 2014-08-05 NOTE — Patient Instructions (Addendum)
  Your next office appointment will be determined based upon review of your pending labs   Those instructions will be transmitted to you by My Chart   Critical results will be called.   Followup as needed for any active or acute issue. Please report any significant change in your symptoms.    If you could possibly have been exposed to poison ivy; immediately shower & use soap liberally to remove oil

## 2014-08-07 ENCOUNTER — Encounter: Payer: Self-pay | Admitting: Internal Medicine

## 2014-08-07 NOTE — Progress Notes (Signed)
Patient request prior authorization be completed for generic Adderall. This has been done and waiting on insurance response.

## 2014-08-07 NOTE — Progress Notes (Addendum)
Adderall has been approved and pharmacy notified. Approval coverage through 08/07/15

## 2014-11-04 ENCOUNTER — Telehealth: Payer: Self-pay | Admitting: Internal Medicine

## 2014-11-04 NOTE — Telephone Encounter (Signed)
Patient is requesting script refill for adderall.  Patient would like 3 scripts.

## 2014-11-04 NOTE — Telephone Encounter (Signed)
OK X3 

## 2014-11-04 NOTE — Telephone Encounter (Signed)
Please advise, last OV 5/16

## 2014-11-05 ENCOUNTER — Other Ambulatory Visit: Payer: Self-pay | Admitting: Emergency Medicine

## 2014-11-05 MED ORDER — AMPHETAMINE-DEXTROAMPHETAMINE 15 MG PO TABS: 15.0000 mg | ORAL_TABLET | Freq: Two times a day (BID) | ORAL | Status: DC

## 2014-11-05 NOTE — Telephone Encounter (Signed)
Called pt to inform her rxs were ready at front office.

## 2015-01-31 ENCOUNTER — Telehealth: Payer: Self-pay | Admitting: *Deleted

## 2015-01-31 MED ORDER — AMPHETAMINE-DEXTROAMPHETAMINE 15 MG PO TABS: 15.0000 mg | ORAL_TABLET | Freq: Two times a day (BID) | ORAL | Status: DC

## 2015-01-31 NOTE — Telephone Encounter (Signed)
Notified pt rx ready for pick-up.../lmb 

## 2015-01-31 NOTE — Telephone Encounter (Signed)
Left msg on triage requesting refills on Adderral.../lmb

## 2015-01-31 NOTE — Telephone Encounter (Signed)
OK X1 Needs to establish with Dr Lawerance BachBurns or other PCP

## 2015-02-07 ENCOUNTER — Encounter: Payer: Self-pay | Admitting: Internal Medicine

## 2015-02-07 ENCOUNTER — Ambulatory Visit (INDEPENDENT_AMBULATORY_CARE_PROVIDER_SITE_OTHER): Payer: 59 | Admitting: Internal Medicine

## 2015-02-07 VITALS — BP 122/78 | HR 99 | Temp 98.2°F | Resp 16 | Wt 185.0 lb

## 2015-02-07 DIAGNOSIS — F909 Attention-deficit hyperactivity disorder, unspecified type: Secondary | ICD-10-CM | POA: Diagnosis not present

## 2015-02-07 DIAGNOSIS — R208 Other disturbances of skin sensation: Secondary | ICD-10-CM

## 2015-02-07 DIAGNOSIS — R200 Anesthesia of skin: Secondary | ICD-10-CM

## 2015-02-07 DIAGNOSIS — F988 Other specified behavioral and emotional disorders with onset usually occurring in childhood and adolescence: Secondary | ICD-10-CM

## 2015-02-07 NOTE — Patient Instructions (Signed)
   Medications reviewed and updated.  No changes recommended at this time.  Try using wrist braces at night and during the day depending on your activity to help the numbness/tingling in your hands.  Call if you want to pursue a nerve test to see if it is carpal tunnel syndrome.   Please schedule followup in 6 months

## 2015-02-07 NOTE — Progress Notes (Addendum)
Subjective:    Patient ID: Adriana Hansen, female    DOB: [birth year], 29 y.o.   MRN: [id number]  HPI She is here to establish with a new pcp.    ADD:  She takes Adderall twice a day most days.  She has been on the medication since 2002 since the end of High School.  The medication helps with her focus and concentration.  Her coworkers noticed decreased concentration/focus and difficulty completing her work when she was off the medication in the past.  She denies side effects, except for decreased appetite.  Her weight has been stable.  She denies palpitations, chest pain, headaches.  She feels her dose is good.   Numbness/tingling in hands.  Her left hand is worse than the right.  She has had this for a while and it occurs during the day and at night.  She denies pain and weakness in the hands.  She denies elbow, shoulder and neck pain.  Dr. Alwyn Ren had advised wrist braces, but they were uncomfortable and she has not been wearing them.    Medications and allergies reviewed with patient and updated if appropriate.  Patient Active Problem List   Diagnosis Date Noted  . Poison ivy dermatitis 08/05/2014  . Attention deficit disorder 09/01/2006    Current Outpatient Prescriptions on File Prior to Visit  Medication Sig Dispense Refill  . amphetamine-dextroamphetamine (ADDERALL) 15 MG tablet Take 1 tablet by mouth 2 (two) times daily. Must estab w/new provider for future refills 60 tablet 0  . mometasone (ELOCON) 0.1 % ointment Apply topically 2 (two) times daily. 15 g 0   No current facility-administered medications on file prior to visit.    Past Medical History  Diagnosis Date  . Gonorrhea 2009  . 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 Tonsillectomy    . Wisdom tooth extraction    . Laparoscopy  10/13/2010    Procedure: LAPAROSCOPY OPERATIVE;  Surgeon: Sherron Monday, MD;  Location: [facility];  Service: Gynecology;  Laterality: N/A;  Lysis of Adhesions. Right Salpingooophrectomy,Evacuation of Hematoperitoneum  . Right oophorectomy  11/13/2010    tubal pregnancy    Social History   Social History  . Marital Status: Single    Spouse Name: N/A  . Number of Children: N/A  . Years of Education: N/A   Social History Main Topics  . Smoking status: Current Every Day Smoker -- 0.50 packs/day  . Smokeless tobacco: Not on file     Comment: smoked 1999- present , up to 1 ppd.. 09/10/13 : 1/2 ppd  . Alcohol Use: No  . Drug Use: No  . Sexual Activity: Yes   Other Topics Concern  . Not on file   Social History Narrative    Review of Systems  Constitutional: Positive for appetite change (decreased). Negative for fever, chills and unexpected weight change.  Respiratory: Negative for cough, shortness of breath and wheezing.   Cardiovascular: Negative for chest pain, palpitations and leg swelling.  Neurological: Negative for dizziness, light-headedness and headaches.       Objective:   Filed Vitals:   02/07/15 0829  BP: 122/78  Pulse: 99  Temp: 98.2 F (36.8 C)  Resp: 16   Filed Weights   02/07/15 0829  Weight: 185 lb (83.915 kg)   Body mass index is 29.87 kg/(m^2).   Physical Exam  Constitutional: She is oriented to  person, place, and time. She appears well-developed and well-nourished. No distress.  Neck: Neck supple. No tracheal deviation present. No thyromegaly present.  Cardiovascular: Normal rate, regular rhythm and normal heart sounds.   No murmur heard. Pulmonary/Chest: Effort normal and breath sounds normal. No respiratory distress. She has no wheezes.  Musculoskeletal: She exhibits no edema.  Lymphadenopathy:    She has no cervical adenopathy.  Neurological: She is alert and oriented to person, place, and time.  Psychiatric: She has a normal mood and affect. Her behavior is normal. Judgment and thought content normal.        Assessment & Plan:    See Problem List for A/P  Follow up in 6 months

## 2015-02-07 NOTE — Assessment & Plan Note (Signed)
Stable, controlled No side effects Taking medications appropriately Will do UDS next visit Continue current dose of 15mg  twice daily of Adderall Follow up in 6 months

## 2015-02-07 NOTE — Assessment & Plan Note (Signed)
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

## 2015-02-07 NOTE — Progress Notes (Signed)
Pre visit review using our clinic review tool, if applicable. No additional management support is needed unless otherwise documented below in the visit note. 

## 2015-03-04 ENCOUNTER — Telehealth: Payer: Self-pay | Admitting: *Deleted

## 2015-03-04 MED ORDER — AMPHETAMINE-DEXTROAMPHETAMINE 15 MG PO TABS: 15.0000 mg | ORAL_TABLET | Freq: Two times a day (BID) | ORAL | Status: DC

## 2015-03-04 NOTE — Telephone Encounter (Signed)
Pt is aware that RX is ready for pick up. 

## 2015-03-04 NOTE — Telephone Encounter (Signed)
printed

## 2015-03-04 NOTE — Telephone Encounter (Signed)
Left msg on triage yesterday afternoon requesting refill on her Adderal.../lmb

## 2015-04-02 ENCOUNTER — Ambulatory Visit (INDEPENDENT_AMBULATORY_CARE_PROVIDER_SITE_OTHER): Payer: BLUE CROSS/BLUE SHIELD | Admitting: Family

## 2015-04-02 VITALS — BP 130/70 | HR 98 | Temp 98.2°F | Ht 66.0 in | Wt 183.4 lb

## 2015-04-02 DIAGNOSIS — F988 Other specified behavioral and emotional disorders with onset usually occurring in childhood and adolescence: Secondary | ICD-10-CM

## 2015-04-02 DIAGNOSIS — L237 Allergic contact dermatitis due to plants, except food: Secondary | ICD-10-CM | POA: Diagnosis not present

## 2015-04-02 DIAGNOSIS — F909 Attention-deficit hyperactivity disorder, unspecified type: Secondary | ICD-10-CM | POA: Diagnosis not present

## 2015-04-02 MED ORDER — AMPHETAMINE-DEXTROAMPHETAMINE 15 MG PO TABS: 15.0000 mg | ORAL_TABLET | Freq: Two times a day (BID) | ORAL | Status: DC

## 2015-04-02 MED ORDER — TRIAMCINOLONE ACETONIDE 0.5 % EX CREA: 1.0000 "application " | TOPICAL_CREAM | Freq: Three times a day (TID) | CUTANEOUS | Status: DC

## 2015-04-02 MED ORDER — PREDNISONE 20 MG PO TABS: ORAL_TABLET | ORAL | Status: DC

## 2015-04-02 NOTE — Patient Instructions (Signed)
Thank you for choosing [facility].  Summary/Instructions:  Your prescription(s) have been submitted to your pharmacy or been printed and provided for you. Please take as directed and contact our office if you believe you are having problem(s) with the medication(s) or have any questions.  If your symptoms worsen or fail to improve, please contact our office for further instruction, or in case of emergency go directly to the emergency room at the closest medical facility.    Poison [employer] Poison ivy is a inflammation of the skin (contact dermatitis) caused by touching the allergens on the leaves of the ivy plant following previous exposure to the plant. The rash usually appears 48 hours after exposure. The rash is usually bumps (papules) or blisters (vesicles) in a linear pattern. Depending on your own sensitivity, the rash may simply cause redness and itching, or it may also progress to blisters which may break open. These must be well cared for to prevent secondary bacterial (germ) infection, followed by scarring. Keep any open areas dry, clean, dressed, and covered with an antibacterial ointment if needed. The eyes may also get puffy. The puffiness is worst in the morning and gets better as the day progresses. This dermatitis usually heals without scarring, within 2 to 3 weeks without treatment. HOME CARE INSTRUCTIONS  Thoroughly wash with soap and water as soon as you have been exposed to poison ivy. You have about one half hour to remove the plant resin before it will cause the rash. This washing will destroy the oil or antigen on the skin that is causing, or will cause, the rash. Be sure to wash under your fingernails as any plant resin there will continue to spread the rash. Do not rub skin vigorously when washing affected area. Poison ivy cannot spread if no oil from the plant remains on your body. A rash that has progressed to weeping sores will not spread the rash unless you have not washed  thoroughly. It is also important to wash any clothes you have been wearing as these may carry active allergens. The rash will return if you wear the unwashed clothing, even several days later. Avoidance of the plant in the future is the best measure. Poison ivy plant can be recognized by the number of leaves. Generally, poison ivy has three leaves with flowering branches on a single stem. Diphenhydramine may be purchased over the counter and used as needed for itching. Do not drive with this medication if it makes you drowsy.Ask your caregiver about medication for children. SEEK MEDICAL CARE IF: 1. Open sores develop. 2. Redness spreads beyond area of rash. 3. You notice purulent (pus-like) discharge. 4. You have increased pain. 5. Other signs of infection develop (such as fever).   This information is not intended to replace advice given to you by your health care provider. Make sure you discuss any questions you have with your health care provider.   Document Released: 03/12/2000 Document Revised: 06/07/2011 Document Reviewed: 08/21/2014 Elsevier Interactive Patient Education [employer]2016 Elsevier Inc.

## 2015-04-02 NOTE — Progress Notes (Signed)
   Subjective:    Patient ID: Adriana Hansen, female    DOB: July 14, 1985, 30 y.o.   MRN: [id number]  Chief Complaint  Patient presents with  . Poison Ivy    HPI:  Adriana Hansen is a 30 y.o. female who  has a past medical history of Gonorrhea (2009) and ADD (attention deficit disorder) without hyperactivity (2002). and presents today for an acute office visit.  Associated symptom of a rash located on her face has been going on for about 1 week. Modifying factors include taking left over prednisone which has helped a little with her symptoms. Rash is located primarily on the right arm and face. Notes that it is continuing to spread. Described as red and itchy.   No Known Allergies   No current outpatient prescriptions on file prior to visit.   No current facility-administered medications on file prior to visit.    Review of Systems  Constitutional: Negative for fever and chills.  Skin: Positive for rash.      Objective:    BP 130/70 mmHg  Pulse 98  Temp(Src) 98.2 F (36.8 C) (Oral)  Ht 5\' 6"  (1.676 m)  Wt 183 lb 6 oz (83.178 kg)  BMI 29.61 kg/m2  SpO2 97% Nursing note and vital signs reviewed.  Physical Exam  Constitutional: She is oriented to person, place, and time. She appears well-developed and well-nourished. No distress.  Cardiovascular: Normal rate, regular rhythm, normal heart sounds and intact distal pulses.   Pulmonary/Chest: Effort normal and breath sounds normal.  Neurological: She is alert and oriented to person, place, and time.  Skin: Skin is warm and dry. Rash (Vesicular rash noted on the right wrist, behind the right ear, and on the right cheek.) noted.  Psychiatric: She has a normal mood and affect. Her behavior is normal. Judgment and thought content normal.       Assessment & Plan:   Problem List Items Addressed This Visit      Musculoskeletal and Integument   Poison ivy dermatitis - Primary    Symptoms and exam consistent with poison ivy  dermatitis. Recommend starting new prednisone taper 12 days and triamcinolone cream. Follow-up if symptoms worsen or fail to improve with medications.      Relevant Medications   predniSONE (DELTASONE) 20 MG tablet   triamcinolone cream (KENALOG) 0.5 %     Other   Attention deficit disorder   Relevant Medications   amphetamine-dextroamphetamine (ADDERALL) 15 MG tablet

## 2015-04-02 NOTE — Progress Notes (Signed)
Pre visit review using our clinic review tool, if applicable. No additional management support is needed unless otherwise documented below in the visit note. 

## 2015-04-02 NOTE — Assessment & Plan Note (Signed)
Symptoms and exam consistent with poison ivy dermatitis. Recommend starting new prednisone taper 12 days and triamcinolone cream. Follow-up if symptoms worsen or fail to improve with medications.

## 2015-04-30 ENCOUNTER — Telehealth: Payer: Self-pay | Admitting: *Deleted

## 2015-04-30 DIAGNOSIS — F988 Other specified behavioral and emotional disorders with onset usually occurring in childhood and adolescence: Secondary | ICD-10-CM

## 2015-04-30 NOTE — Telephone Encounter (Signed)
Receive call pt requesting refill on her [name].../lmb 

## 2015-05-01 MED ORDER — AMPHETAMINE-DEXTROAMPHETAMINE 15 MG PO TABS: 15.0000 mg | ORAL_TABLET | Freq: Two times a day (BID) | ORAL | Status: DC

## 2015-05-01 NOTE — Telephone Encounter (Signed)
Notified pt rx ready for pick-up.../lmb 

## 2015-05-01 NOTE — Telephone Encounter (Signed)
printed

## 2015-05-29 ENCOUNTER — Telehealth: Payer: Self-pay | Admitting: Internal Medicine

## 2015-05-29 DIAGNOSIS — F988 Other specified behavioral and emotional disorders with onset usually occurring in childhood and adolescence: Secondary | ICD-10-CM

## 2015-05-29 MED ORDER — AMPHETAMINE-DEXTROAMPHETAMINE 15 MG PO TABS: 15.0000 mg | ORAL_TABLET | Freq: Two times a day (BID) | ORAL | Status: DC

## 2015-05-29 NOTE — Telephone Encounter (Signed)
Pt request refill for Adderall.

## 2015-05-29 NOTE — Telephone Encounter (Signed)
printed

## 2015-05-30 NOTE — Telephone Encounter (Signed)
LVM informing pt rx was ready for pick-up 

## 2015-07-01 ENCOUNTER — Telehealth: Payer: Self-pay | Admitting: *Deleted

## 2015-07-01 DIAGNOSIS — F988 Other specified behavioral and emotional disorders with onset usually occurring in childhood and adolescence: Secondary | ICD-10-CM

## 2015-07-01 NOTE — Telephone Encounter (Signed)
Requesting refills on [name]...Raechel Chute/lmb

## 2015-07-02 MED ORDER — AMPHETAMINE-DEXTROAMPHETAMINE 15 MG PO TABS: 15.0000 mg | ORAL_TABLET | Freq: Two times a day (BID) | ORAL | Status: DC

## 2015-07-02 NOTE — Telephone Encounter (Signed)
Called pt no answer [facility] rx ready for pick-up.../lmb 

## 2015-07-02 NOTE — Telephone Encounter (Signed)
printed

## 2015-07-31 ENCOUNTER — Encounter: Payer: Self-pay | Admitting: Internal Medicine

## 2015-07-31 ENCOUNTER — Ambulatory Visit (INDEPENDENT_AMBULATORY_CARE_PROVIDER_SITE_OTHER): Payer: BLUE CROSS/BLUE SHIELD | Admitting: Internal Medicine

## 2015-07-31 VITALS — BP 130/78 | HR 82 | Temp 98.2°F | Resp 16 | Wt 185.0 lb

## 2015-07-31 DIAGNOSIS — L989 Disorder of the skin and subcutaneous tissue, unspecified: Secondary | ICD-10-CM | POA: Diagnosis not present

## 2015-07-31 DIAGNOSIS — F988 Other specified behavioral and emotional disorders with onset usually occurring in childhood and adolescence: Secondary | ICD-10-CM

## 2015-07-31 DIAGNOSIS — F909 Attention-deficit hyperactivity disorder, unspecified type: Secondary | ICD-10-CM

## 2015-07-31 MED ORDER — AMPHETAMINE-DEXTROAMPHETAMINE 15 MG PO TABS: 15.0000 mg | ORAL_TABLET | Freq: Two times a day (BID) | ORAL | Status: DC

## 2015-07-31 NOTE — Progress Notes (Signed)
Subjective:    Patient ID: Adriana Hansen, female    DOB: Oct 17, 1985, 30 y.o.   MRN: [id number]  HPI She is here for follow up.   ADD:  She is taking her medication daily as prescribed.  She denies any side effects.  She feels her current dose is effective and does not feel that any changes are necessary.    Spot on left cheek:  It has been there over two months. She is able to extract fluid or pus out of it.  She has been able to do that a few times over the past 2 months. The area is starting to become darker from her manipulation. She wonders if it is a cyst and is interested in having it removed.   Medications and allergies reviewed with patient and updated if appropriate.  Patient Active Problem List   Diagnosis Date Noted  . Numbness of fingers of both hands 02/07/2015  . Attention deficit disorder 09/01/2006    Current Outpatient Prescriptions on File Prior to Visit  Medication Sig Dispense Refill  . amphetamine-dextroamphetamine (ADDERALL) 15 MG tablet Take 1 tablet by mouth 2 (two) times daily. 60 tablet 0   No current facility-administered medications on file prior to visit.    Past Medical History  Diagnosis Date  . Gonorrhea 2009  . 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 Tonsillectomy    . Wisdom tooth extraction    . Laparoscopy  10/13/2010    Procedure: LAPAROSCOPY OPERATIVE;  Surgeon: Sherron MondayJody Bovard, MD;  Location: [facility];  Service: Gynecology;  Laterality: N/A;  Lysis of Adhesions. Right Salpingooophrectomy,Evacuation of Hematoperitoneum  . Right oophorectomy  11/13/2010    tubal pregnancy    Social History   Social History  . Marital Status: Single    Spouse Name: N/A  . Number of Children: N/A  . Years of Education: N/A   Social History Main Topics  . Smoking status: Current Every Day Smoker -- 0.50 packs/day  . Smokeless tobacco: Never Used   Comment: smoked 1999- present , up to 1 ppd.. 09/10/13 : 1/2 ppd  . Alcohol Use: Yes     Comment: rare  . Drug Use: No  . Sexual Activity: Yes   Other Topics Concern  . Not on file   Social History Narrative    Family History  Problem Relation Age of Onset  . Hyperlipidemia Father   . Hypertension Father   . Hyperlipidemia Brother   . Hyperlipidemia Maternal Grandfather   . Hypertension Maternal Grandfather   . Hyperlipidemia Mother   . Hyperlipidemia Maternal Grandmother   . Cancer Neg Hx   . Stroke Neg Hx   . Diabetes Neg Hx   . Heart disease Neg Hx   . Parkinson's disease Maternal Grandfather     Review of Systems  Constitutional: Negative for appetite change and unexpected weight change.  Cardiovascular: Negative for chest pain and palpitations.  Neurological: Negative for dizziness, light-headedness and headaches.       Objective:   Filed Vitals:   07/31/15 0937  BP: 130/78  Pulse: 82  Temp: 98.2 F (36.8 C)  Resp: 16   Filed Weights   07/31/15 0937  Weight: 185 lb (83.915 kg)   Body mass index is 29.87 kg/(m^2).   Physical Exam Constitutional: Appears well-developed and well-nourished. No distress.   Cardiovascular: Normal rate, regular rhythm and normal  heart sounds.   No murmur heard.  No edema Pulmonary/Chest: Effort normal and breath sounds normal. No respiratory distress. No wheezes.  Skin: Small area of hyperpigmentation left middle cheek-no obvious opening or cyst  Psych: Normal mood and affect        Assessment & Plan:   See Problem List for Assessment and Plan of chronic medical problems.  Follow-up in 6 months

## 2015-07-31 NOTE — Patient Instructions (Addendum)
   Medications reviewed and updated.  No changes recommended at this time.  Your prescription was refilled today.    A referral was ordered for [facility].  Please followup in 6 months

## 2015-07-31 NOTE — Assessment & Plan Note (Signed)
Stable, controlled Taking medication appropriately without evidence of abuse Continue current dose of Adderall 15 mg twice daily Follow up in 6 months

## 2015-07-31 NOTE — Progress Notes (Signed)
Pre visit review using our clinic review tool, if applicable. No additional management support is needed unless otherwise documented below in the visit note. 

## 2015-08-07 ENCOUNTER — Ambulatory Visit: Payer: 59 | Admitting: Internal Medicine

## 2015-08-12 DIAGNOSIS — L700 Acne vulgaris: Secondary | ICD-10-CM | POA: Diagnosis not present

## 2015-08-12 DIAGNOSIS — D2361 Other benign neoplasm of skin of right upper limb, including shoulder: Secondary | ICD-10-CM | POA: Diagnosis not present

## 2015-10-28 ENCOUNTER — Telehealth: Payer: Self-pay | Admitting: *Deleted

## 2015-10-28 DIAGNOSIS — F988 Other specified behavioral and emotional disorders with onset usually occurring in childhood and adolescence: Secondary | ICD-10-CM

## 2015-10-28 MED ORDER — AMPHETAMINE-DEXTROAMPHETAMINE 15 MG PO TABS: 15.0000 mg | ORAL_TABLET | Freq: Two times a day (BID) | ORAL | 0 refills | Status: DC

## 2015-10-28 NOTE — Telephone Encounter (Signed)
printed

## 2015-10-28 NOTE — Telephone Encounter (Signed)
Rec'd call pt is requesting refill on her [name]...Adriana Hansen

## 2015-10-28 NOTE — Telephone Encounter (Signed)
Notified pt rx ready for pick-up.../lmb 

## 2015-11-27 ENCOUNTER — Telehealth: Payer: Self-pay | Admitting: Emergency Medicine

## 2015-11-27 DIAGNOSIS — F988 Other specified behavioral and emotional disorders with onset usually occurring in childhood and adolescence: Secondary | ICD-10-CM

## 2015-11-27 MED ORDER — AMPHETAMINE-DEXTROAMPHETAMINE 15 MG PO TABS: 15.0000 mg | ORAL_TABLET | Freq: Two times a day (BID) | ORAL | 0 refills | Status: DC

## 2015-11-27 NOTE — Telephone Encounter (Signed)
Medication refilled

## 2015-11-27 NOTE — Telephone Encounter (Signed)
Pt called and needs a prescription refill on amphetamine-dextroamphetamine (ADDERALL) 15 MG tablet. Pharmacy is [facility] [address]. Pt will come by and pick up. Thanks.

## 2015-11-27 NOTE — Telephone Encounter (Signed)
Are you okay with filling this, Dr Lawerance BachBurns is out of the office until 9/617. Pts last fill was 10/28/15

## 2015-11-27 NOTE — Telephone Encounter (Signed)
Notified pt rx is ready for pick up  

## 2015-11-27 NOTE — Addendum Note (Signed)
Addended by: Jeanine LuzALONE, GREGORY D on: 11/27/2015 01:47 PM   Modules accepted: Orders

## 2015-12-25 ENCOUNTER — Telehealth: Payer: Self-pay | Admitting: *Deleted

## 2015-12-25 DIAGNOSIS — F988 Other specified behavioral and emotional disorders with onset usually occurring in childhood and adolescence: Secondary | ICD-10-CM

## 2015-12-25 MED ORDER — AMPHETAMINE-DEXTROAMPHETAMINE 15 MG PO TABS: 15.0000 mg | ORAL_TABLET | Freq: Two times a day (BID) | ORAL | 0 refills | Status: DC

## 2015-12-25 NOTE — Telephone Encounter (Signed)
Medication refilled

## 2015-12-25 NOTE — Telephone Encounter (Signed)
Rec'd call pt requesting refill on her [name]. MD is out of office pls advise on refill...Adriana Hansen/lmb

## 2015-12-25 NOTE — Telephone Encounter (Signed)
Notified pt rx ready for pick-up.../lmb 

## 2016-01-26 ENCOUNTER — Telehealth: Payer: Self-pay | Admitting: *Deleted

## 2016-01-26 DIAGNOSIS — F909 Attention-deficit hyperactivity disorder, unspecified type: Secondary | ICD-10-CM

## 2016-01-26 NOTE — Telephone Encounter (Signed)
Rec call pt requesting refill on her [name]...Raechel Chute/lmb

## 2016-01-26 NOTE — Telephone Encounter (Signed)
[place] controlled substance database checked.  Ok to fill medication.  

## 2016-01-27 MED ORDER — AMPHETAMINE-DEXTROAMPHETAMINE 15 MG PO TABS: 15.0000 mg | ORAL_TABLET | Freq: Two times a day (BID) | ORAL | 0 refills | Status: DC

## 2016-01-27 NOTE — Telephone Encounter (Signed)
Notified pt rx ready for pick-up.../lmb 

## 2016-02-03 ENCOUNTER — Encounter: Payer: Self-pay | Admitting: Internal Medicine

## 2016-02-03 ENCOUNTER — Ambulatory Visit (INDEPENDENT_AMBULATORY_CARE_PROVIDER_SITE_OTHER): Payer: BLUE CROSS/BLUE SHIELD | Admitting: Internal Medicine

## 2016-02-03 VITALS — BP 132/78 | HR 92 | Temp 98.4°F | Resp 16 | Ht 66.0 in | Wt 194.0 lb

## 2016-02-03 DIAGNOSIS — F988 Other specified behavioral and emotional disorders with onset usually occurring in childhood and adolescence: Secondary | ICD-10-CM

## 2016-02-03 NOTE — Progress Notes (Signed)
Pre visit review using our clinic review tool, if applicable. No additional management support is needed unless otherwise documented below in the visit note. 

## 2016-02-03 NOTE — Assessment & Plan Note (Signed)
Well controlled No side effects from medication Continue current dose Advised to stop medication prior to getting pregnant - she is thinking about getting pregnant, but not actively trying

## 2016-02-03 NOTE — Patient Instructions (Addendum)
  No immunizations administered today.   Medications reviewed and updated.  No changes recommended at this time.    Please followup in 6 months   

## 2016-02-03 NOTE — Progress Notes (Signed)
Subjective:    Patient ID: Adriana Hansen, female    DOB: 1985/04/01, 30 y.o.   MRN: [id number]  HPI She is here for follow up.   ADD:  She is taking her medication as prescribed.  She feels the medication is effective.  She denies side effects, including palpitations, headaches, lightheadedness, decreased appetite and weight loss.    Medications and allergies reviewed with patient and updated if appropriate.  Patient Active Problem List   Diagnosis Date Noted  . Numbness of fingers of both hands 02/07/2015  . Attention deficit disorder 09/01/2006    Current Outpatient Prescriptions on File Prior to Visit  Medication Sig Dispense Refill  . amphetamine-dextroamphetamine (ADDERALL) 15 MG tablet Take 1 tablet by mouth 2 (two) times daily. 60 tablet 0   No current facility-administered medications on file prior to visit.     Past Medical History:  Diagnosis Date  . 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 Gonorrhea 2009    Past Surgical History:  Procedure Laterality Date  . LAPAROSCOPY  10/13/2010   Procedure: LAPAROSCOPY OPERATIVE;  Surgeon: Sherron MondayJody Bovard, MD;  Location: [facility];  Service: Gynecology;  Laterality: N/A;  Lysis of Adhesions. Right Salpingooophrectomy,Evacuation of Hematoperitoneum  . RIGHT OOPHORECTOMY  11/13/2010   tubal pregnancy  . TONSILLECTOMY    . WISDOM TOOTH EXTRACTION      Social History   Social History  . Marital status: Single    Spouse name: N/A  . Number of children: N/A  . Years of education: N/A   Social History Main Topics  . Smoking status: Current Every Day Smoker    Packs/day: 0.50  . Smokeless tobacco: Never Used     Comment: smoked 1999- present , up to 1 ppd.. 09/10/13 : 1/2 ppd  . Alcohol use Yes     Comment: rare  . Drug use: No  . Sexual activity: Yes   Other Topics Concern  . None   Social History Narrative  . None    Family History  Problem Relation Age of Onset  .  Hyperlipidemia Father   . Hypertension Father   . Hyperlipidemia Brother   . Hyperlipidemia Maternal Grandfather   . Hypertension Maternal Grandfather   . Hyperlipidemia Mother   . Hyperlipidemia Maternal Grandmother   . Cancer Neg Hx   . Stroke Neg Hx   . Diabetes Neg Hx   . Heart disease Neg Hx   . Parkinson's disease Maternal Grandfather     Review of Systems  Constitutional: Negative for appetite change.  Respiratory: Negative for shortness of breath.   Cardiovascular: Negative for chest pain, palpitations and leg swelling.  Neurological: Negative for light-headedness and headaches.       Objective:   Vitals:   02/03/16 0832  BP: 132/78  Pulse: 92  Resp: 16  Temp: 98.4 F (36.9 C)   Filed Weights   02/03/16 0832  Weight: 194 lb (88 kg)   Body mass index is 31.31 kg/m.   Physical Exam Constitutional: Appears well-developed and well-nourished. No distress.  HENT:  Head: Normocephalic and atraumatic.  Neck: Neck supple. No tracheal deviation present. No thyromegaly present.  No cervical lymphadenopathy Cardiovascular: Normal rate, regular rhythm and normal heart sounds.   No murmur heard. No carotid bruit .  No edema Pulmonary/Chest: Effort normal and breath sounds normal. No respiratory distress. No has no wheezes. No rales.  Skin: Skin is warm and dry. Not diaphoretic.  Psychiatric: Normal mood and affect. Behavior is normal.         Assessment & Plan:    Advised her to work on quitting smoking and losing weight.  See Problem List for Assessment and Plan of chronic medical problems.

## 2016-02-25 ENCOUNTER — Telehealth: Payer: Self-pay | Admitting: *Deleted

## 2016-02-25 DIAGNOSIS — F909 Attention-deficit hyperactivity disorder, unspecified type: Secondary | ICD-10-CM

## 2016-02-25 NOTE — Telephone Encounter (Signed)
[place] controlled substance database checked.  Ok to fill medication.  

## 2016-02-25 NOTE — Telephone Encounter (Signed)
Rec'd call pt requesting refill on her [name].../lmb 

## 2016-02-26 MED ORDER — AMPHETAMINE-DEXTROAMPHETAMINE 15 MG PO TABS: 15.0000 mg | ORAL_TABLET | Freq: Two times a day (BID) | ORAL | 0 refills | Status: DC

## 2016-02-26 NOTE — Telephone Encounter (Signed)
Printed for MDs signature.

## 2016-02-27 NOTE — Telephone Encounter (Signed)
LVM informing pt rx was ready for pick-up 

## 2016-03-17 ENCOUNTER — Encounter: Payer: Self-pay | Admitting: Internal Medicine

## 2016-03-17 ENCOUNTER — Ambulatory Visit (INDEPENDENT_AMBULATORY_CARE_PROVIDER_SITE_OTHER): Payer: BLUE CROSS/BLUE SHIELD | Admitting: Internal Medicine

## 2016-03-17 DIAGNOSIS — R21 Rash and other nonspecific skin eruption: Secondary | ICD-10-CM | POA: Diagnosis not present

## 2016-03-17 MED ORDER — PREDNISONE 10 MG PO TABS: ORAL_TABLET | ORAL | 0 refills | Status: DC

## 2016-03-17 NOTE — Progress Notes (Signed)
   Subjective:    Patient ID: Adriana Hansen, female    DOB: 01-22-1986, 30 y.o.   MRN: [id number]  HPI  Here to f/u with 2-3 days osnet itchy red rash that started just post to the right earlobe, then seemed to spread to right preauricular, then maxillary and slight upper periorbital area as well, all without fever, swelling or drainage, no red streaks but + itchy and annoying.  Had similar allergic type reaction before on the left side years ago.  Has a outdoor cat that will jump on her shoulder and rub against her neck and face, likely how it happened on the left last time. Pt denies chest pain, increased sob or doe, wheezing, orthopnea, PND, increased LE swelling, palpitations, dizziness or syncope.  Past Medical History:  Diagnosis Date  . 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 Gonorrhea 2009   Past Surgical History:  Procedure Laterality Date  . LAPAROSCOPY  10/13/2010   Procedure: LAPAROSCOPY OPERATIVE;  Surgeon: Sherron MondayJody Bovard, MD;  Location: [facility];  Service: Gynecology;  Laterality: N/A;  Lysis of Adhesions. Right Salpingooophrectomy,Evacuation of Hematoperitoneum  . RIGHT OOPHORECTOMY  11/13/2010   tubal pregnancy  . TONSILLECTOMY    . WISDOM TOOTH EXTRACTION      reports that she has been smoking.  She has been smoking about 0.50 packs per day. She has never used smokeless tobacco. She reports that she drinks alcohol. She reports that she does not use drugs. family history includes Hyperlipidemia in her brother, father, maternal grandfather, maternal grandmother, and mother; Hypertension in her father and maternal grandfather; Parkinson's disease in her maternal grandfather. No Known Allergies Current Outpatient Prescriptions on File Prior to Visit  Medication Sig Dispense Refill  . amphetamine-dextroamphetamine (ADDERALL) 15 MG tablet Take 1 tablet by mouth 2 (two) times daily. 60 tablet 0   No current facility-administered medications on  file prior to visit.    Review of Systems All otherwise neg per pt     Objective:   Physical Exam BP 132/72   Pulse (!) 102   Resp 20   Wt 197 lb (89.4 kg)   SpO2 97%   BMI 31.80 kg/m  VS noted,  Constitutional: Pt appears in no apparent distress HENT: Head: NCAT.  Right Ear: External ear normal.  Left Ear: External ear normal.  Eyes: . Pupils are equal, round, and reactive to light. Conjunctivae and EOM are normal Neck: Normal range of motion. Neck supple.  Cardiovascular: Normal rate and regular rhythm.   Pulmonary/Chest: Effort normal and breath sounds without rales or wheezing.  Neurological: Pt is alert. Not confused , motor grossly intact Skin: Skin is warm.  no LE edema but has right preauricular, right lateral upper periorbital, and right face and maxillary sinus areas with nontender erythema and slight swelling without ulcer or drainage Psychiatric: Pt behavior is normal. No agitation.     Assessment & Plan:

## 2016-03-17 NOTE — Progress Notes (Signed)
Pre visit review using our clinic review tool, if applicable. No additional management support is needed unless otherwise documented below in the visit note. 

## 2016-03-17 NOTE — Patient Instructions (Signed)
You had the steroid shot today  Please take all new medication as prescribed - the prednisone  You can also use the triamcinolone cream you have at home, or benadryl cream to help with the itching  Please continue all other medications as before, and refills have been done if requested.  Please have the pharmacy call with any other refills you may need.  Please keep your appointments with your specialists as you may have planned

## 2016-03-17 NOTE — Assessment & Plan Note (Signed)
C/w allergic rash likely cat related by direct contact, to avoid this type of contact in future, depomedrol IM 80, and predpac asd, also has triam cr prn a thome and benadryl otc cream prn, to f/u any worsening symptoms or concerns

## 2016-03-24 ENCOUNTER — Telehealth: Payer: Self-pay | Admitting: *Deleted

## 2016-03-24 DIAGNOSIS — F909 Attention-deficit hyperactivity disorder, unspecified type: Secondary | ICD-10-CM

## 2016-03-24 NOTE — Telephone Encounter (Signed)
Rec'd call pt requesting refill on her [name].../lmb 

## 2016-03-25 MED ORDER — AMPHETAMINE-DEXTROAMPHETAMINE 15 MG PO TABS
15.0000 mg | ORAL_TABLET | Freq: Two times a day (BID) | ORAL | 0 refills | Status: DC
Start: 2016-03-25 — End: 2016-04-23

## 2016-03-25 NOTE — Telephone Encounter (Signed)
Notified pt rx ready for pick-up.../lmb 

## 2016-03-25 NOTE — Telephone Encounter (Signed)
Ok to fill 

## 2016-04-23 ENCOUNTER — Telehealth: Payer: Self-pay | Admitting: *Deleted

## 2016-04-23 DIAGNOSIS — F909 Attention-deficit hyperactivity disorder, unspecified type: Secondary | ICD-10-CM

## 2016-04-23 MED ORDER — AMPHETAMINE-DEXTROAMPHETAMINE 15 MG PO TABS: 15.0000 mg | ORAL_TABLET | Freq: Two times a day (BID) | ORAL | 0 refills | Status: DC

## 2016-04-23 NOTE — Telephone Encounter (Signed)
Rec'd call pt requesting refill on her [name]. MD is out of office until next week pls advise on refill...Adriana Hansen/lmb

## 2016-04-23 NOTE — Telephone Encounter (Signed)
OK to fill this/these prescription(s) with additional refills x0 Needs to have an OV w/PCP every 3 months Thank you!

## 2016-04-23 NOTE — Telephone Encounter (Signed)
Called pt no answer [facility] rx ready for pick-up.../lmb 

## 2016-05-25 ENCOUNTER — Telehealth: Payer: Self-pay | Admitting: *Deleted

## 2016-05-25 DIAGNOSIS — F909 Attention-deficit hyperactivity disorder, unspecified type: Secondary | ICD-10-CM

## 2016-05-25 NOTE — Telephone Encounter (Signed)
Rec'd call pt requesting refill on her [name].../lmb 

## 2016-05-25 NOTE — Telephone Encounter (Signed)
[place] controlled substance database checked.  Ok to fill medication.  

## 2016-05-26 MED ORDER — AMPHETAMINE-DEXTROAMPHETAMINE 15 MG PO TABS: 15.0000 mg | ORAL_TABLET | Freq: Two times a day (BID) | ORAL | 0 refills | Status: DC

## 2016-05-26 NOTE — Telephone Encounter (Signed)
Printed script notified pt rx ready for pick-up...Adriana Hansen/lmb

## 2016-06-21 ENCOUNTER — Telehealth: Payer: Self-pay | Admitting: *Deleted

## 2016-06-21 DIAGNOSIS — F909 Attention-deficit hyperactivity disorder, unspecified type: Secondary | ICD-10-CM

## 2016-06-21 NOTE — Telephone Encounter (Signed)
Rec'd call pt requesting refill on her [name].../lmb 

## 2016-06-21 NOTE — Telephone Encounter (Signed)
[place] controlled substance database checked.  Ok to fill medication.  

## 2016-06-22 MED ORDER — AMPHETAMINE-DEXTROAMPHETAMINE 15 MG PO TABS: 15.0000 mg | ORAL_TABLET | Freq: Two times a day (BID) | ORAL | 0 refills | Status: DC

## 2016-06-22 NOTE — Telephone Encounter (Signed)
Notified pt rx ready for pick-up.../lmb 

## 2016-07-22 ENCOUNTER — Telehealth: Payer: Self-pay | Admitting: *Deleted

## 2016-07-22 DIAGNOSIS — F909 Attention-deficit hyperactivity disorder, unspecified type: Secondary | ICD-10-CM

## 2016-07-22 MED ORDER — AMPHETAMINE-DEXTROAMPHETAMINE 15 MG PO TABS: 15.0000 mg | ORAL_TABLET | Freq: Two times a day (BID) | ORAL | 0 refills | Status: DC

## 2016-07-22 NOTE — Telephone Encounter (Signed)
Notified pt rx ready for pick-up.../lmb 

## 2016-07-22 NOTE — Telephone Encounter (Signed)
Rec'd call pt requesting refill on her [name].../lmb 

## 2016-07-22 NOTE — Telephone Encounter (Signed)
rx printed

## 2016-07-31 ENCOUNTER — Encounter: Payer: Self-pay | Admitting: Internal Medicine

## 2016-07-31 NOTE — Patient Instructions (Addendum)
Check with your insurance to make sure blood work is covered.  Your blood work has been ordered.   Test(s) ordered today. Your results will be released to [place] (or called to you) after review, usually within 72hours after test completion. If any changes need to be made, you will be notified at that same time.  All other Health Maintenance issues reviewed.   All recommended immunizations and age-appropriate screenings are up-to-date or discussed.  No immunizations administered today.   Medications reviewed and updated.  No changes recommended at this time.   Please followup in 6 months   Health Maintenance, Female Adopting a healthy lifestyle and getting preventive care can go a long way to promote health and wellness. Talk with your health care provider about what schedule of regular examinations is right for you. This is a good chance for you to check in with your provider about disease prevention and staying healthy. In between checkups, there are plenty of things you can do on your own. Experts have done a lot of research about which lifestyle changes and preventive measures are most likely to keep you healthy. Ask your health care provider for more information. Weight and diet Eat a healthy diet  Be sure to include plenty of vegetables, fruits, low-fat dairy products, and lean protein.  Do not eat a lot of foods high in solid fats, added sugars, or salt.  Get regular exercise. This is one of the most important things you can do for your health.  Most adults should exercise for at least 150 minutes each week. The exercise should increase your heart rate and make you sweat (moderate-intensity exercise).  Most adults should also do strengthening exercises at least twice a week. This is in addition to the moderate-intensity exercise. Maintain a healthy weight  Body mass index (BMI) is a measurement that can be used to identify possible weight problems. It estimates body fat based on  height and weight. Your health care provider can help determine your BMI and help you achieve or maintain a healthy weight.  For females 50 years of age and older:  A BMI below 18.5 is considered underweight.  A BMI of 18.5 to 24.9 is normal.  A BMI of 25 to 29.9 is considered overweight.  A BMI of 30 and above is considered obese. Watch levels of cholesterol and blood lipids  You should start having your blood tested for lipids and cholesterol at 33 years of age, then have this test every 5 years.  You may need to have your cholesterol levels checked more often if:  Your lipid or cholesterol levels are high.  You are older than 33 years of age.  You are at high risk for heart disease. Cancer screening Lung Cancer  Lung cancer screening is recommended for adults 47-81 years old who are at high risk for lung cancer because of a history of smoking.  A yearly low-dose CT scan of the lungs is recommended for people who:  Currently smoke.  Have quit within the past 15 years.  Have at least a 30-pack-year history of smoking. A pack year is smoking an average of one pack of cigarettes a day for 1 year.  Yearly screening should continue until it has been 15 years since you quit.  Yearly screening should stop if you develop a health problem that would prevent you from having lung cancer treatment. Breast Cancer  Practice breast self-awareness. This means understanding how your breasts normally appear and feel.  It also means doing regular breast self-exams. Let your health care provider know about any changes, no matter how small.  If you are in your 20s or 30s, you should have a clinical breast exam (CBE) by a health care provider every 1-3 years as part of a regular health exam.  If you are 8 or older, have a CBE every year. Also consider having a breast X-ray (mammogram) every year.  If you have a family history of breast cancer, talk to your health care provider about  genetic screening.  If you are at high risk for breast cancer, talk to your health care provider about having an MRI and a mammogram every year.  Breast cancer gene (BRCA) assessment is recommended for women who have family members with BRCA-related cancers. BRCA-related cancers include:  Breast.  Ovarian.  Tubal.  Peritoneal cancers.  Results of the assessment will determine the need for genetic counseling and BRCA1 and BRCA2 testing. Cervical Cancer  Your health care provider may recommend that you be screened regularly for cancer of the pelvic organs (ovaries, uterus, and vagina). This screening involves a pelvic examination, including checking for microscopic changes to the surface of your cervix (Pap test). You may be encouraged to have this screening done every 3 years, beginning at age 23.  For women ages 75-65, health care providers may recommend pelvic exams and Pap testing every 3 years, or they may recommend the Pap and pelvic exam, combined with testing for human papilloma virus (HPV), every 5 years. Some types of HPV increase your risk of cervical cancer. Testing for HPV may also be done on women of any age with unclear Pap test results.  Other health care providers may not recommend any screening for nonpregnant women who are considered low risk for pelvic cancer and who do not have symptoms. Ask your health care provider if a screening pelvic exam is right for you.  If you have had past treatment for cervical cancer or a condition that could lead to cancer, you need Pap tests and screening for cancer for at least 20 years after your treatment. If Pap tests have been discontinued, your risk factors (such as having a new sexual partner) need to be reassessed to determine if screening should resume. Some women have medical problems that increase the chance of getting cervical cancer. In these cases, your health care provider may recommend more frequent screening and Pap  tests. Colorectal Cancer  This type of cancer can be detected and often prevented.  Routine colorectal cancer screening usually begins at 32 years of age and continues through 33 years of age.  Your health care provider may recommend screening at an earlier age if you have risk factors for colon cancer.  Your health care provider may also recommend using home test kits to check for hidden blood in the stool.  A small camera at the end of a tube can be used to examine your colon directly (sigmoidoscopy or colonoscopy). This is done to check for the earliest forms of colorectal cancer.  Routine screening usually begins at age 85.  Direct examination of the colon should be repeated every 5-10 years through 33 years of age. However, you may need to be screened more often if early forms of precancerous polyps or small growths are found. Skin Cancer  Check your skin from head to toe regularly.  Tell your health care provider about any new moles or changes in moles, especially if there is a change in  a mole's shape or color.  Also tell your health care provider if you have a mole that is larger than the size of a pencil eraser.  Always use sunscreen. Apply sunscreen liberally and repeatedly throughout the day.  Protect yourself by wearing long sleeves, pants, a wide-brimmed hat, and sunglasses whenever you are outside. Heart disease, diabetes, and high blood pressure  High blood pressure causes heart disease and increases the risk of stroke. High blood pressure is more likely to develop in:  People who have blood pressure in the high end of the normal range (130-139/85-89 mm Hg).  People who are overweight or obese.  People who are African American.  If you are 80-13 years of age, have your blood pressure checked every 3-5 years. If you are 78 years of age or older, have your blood pressure checked every year. You should have your blood pressure measured twice-once when you are at a  hospital or clinic, and once when you are not at a hospital or clinic. Record the average of the two measurements. To check your blood pressure when you are not at a hospital or clinic, you can use:  An automated blood pressure machine at a pharmacy.  A home blood pressure monitor.  If you are between 66 years and 8 years old, ask your health care provider if you should take aspirin to prevent strokes.  Have regular diabetes screenings. This involves taking a blood sample to check your fasting blood sugar level.  If you are at a normal weight and have a low risk for diabetes, have this test once every three years after 33 years of age.  If you are overweight and have a high risk for diabetes, consider being tested at a younger age or more often. Preventing infection Hepatitis B  If you have a higher risk for hepatitis B, you should be screened for this virus. You are considered at high risk for hepatitis B if:  You were born in a country where hepatitis B is common. Ask your health care provider which countries are considered high risk.  Your parents were born in a high-risk country, and you have not been immunized against hepatitis B (hepatitis B vaccine).  You have HIV or AIDS.  You use needles to inject street drugs.  You live with someone who has hepatitis B.  You have had sex with someone who has hepatitis B.  You get hemodialysis treatment.  You take certain medicines for conditions, including cancer, organ transplantation, and autoimmune conditions. Hepatitis C  Blood testing is recommended for:  Everyone born from [birth year] through 1967.  Anyone with known risk factors for hepatitis C. Sexually transmitted infections (STIs)  You should be screened for sexually transmitted infections (STIs) including gonorrhea and chlamydia if:  You are sexually active and are younger than 33 years of age.  You are older than 34 years of age and your health care provider tells you  that you are at risk for this type of infection.  Your sexual activity has changed since you were last screened and you are at an increased risk for chlamydia or gonorrhea. Ask your health care provider if you are at risk.  If you do not have HIV, but are at risk, it may be recommended that you take a prescription medicine daily to prevent HIV infection. This is called pre-exposure prophylaxis (PrEP). You are considered at risk if:  You are sexually active and do not regularly use condoms or know the  HIV status of your partner(s).  You take drugs by injection.  You are sexually active with a partner who has HIV. Talk with your health care provider about whether you are at high risk of being infected with HIV. If you choose to begin PrEP, you should first be tested for HIV. You should then be tested every 3 months for as long as you are taking PrEP. Pregnancy  If you are premenopausal and you may become pregnant, ask your health care provider about preconception counseling.  If you may become pregnant, take 400 to 800 micrograms (mcg) of folic acid every day.  If you want to prevent pregnancy, talk to your health care provider about birth control (contraception). Osteoporosis and menopause  Osteoporosis is a disease in which the bones lose minerals and strength with aging. This can result in serious bone fractures. Your risk for osteoporosis can be identified using a bone density scan.  If you are 2 years of age or older, or if you are at risk for osteoporosis and fractures, ask your health care provider if you should be screened.  Ask your health care provider whether you should take a calcium or vitamin D supplement to lower your risk for osteoporosis.  Menopause may have certain physical symptoms and risks.  Hormone replacement therapy may reduce some of these symptoms and risks. Talk to your health care provider about whether hormone replacement therapy is right for you. Follow  these instructions at home:  Schedule regular health, dental, and eye exams.  Stay current with your immunizations.  Do not use any tobacco products including cigarettes, chewing tobacco, or electronic cigarettes.  If you are pregnant, do not drink alcohol.  If you are breastfeeding, limit how much and how often you drink alcohol.  Limit alcohol intake to no more than 1 drink per day for nonpregnant women. One drink equals 12 ounces of beer, 5 ounces of wine, or 1 ounces of hard liquor.  Do not use street drugs.  Do not share needles.  Ask your health care provider for help if you need support or information about quitting drugs.  Tell your health care provider if you often feel depressed.  Tell your health care provider if you have ever been abused or do not feel safe at home. This information is not intended to replace advice given to you by your health care provider. Make sure you discuss any questions you have with your health care provider. Document Released: 09/28/2010 Document Revised: 08/21/2015 Document Reviewed: 12/17/2014 Elsevier Interactive Patient Education  2017 [employer].

## 2016-07-31 NOTE — Progress Notes (Signed)
Subjective:    Patient ID: Adriana Hansen, female    DOB: 1985/04/24, 31 y.o.   MRN: [id number]  HPI She is here for a physical exam.    She is still smoking cigarettes.  She knows she should quit, but is not 100% ready to quit.   ADD:  She is taking her medication as prescribed.  She feels the medication is effective.  She denies side effects, including palpitations, headaches, lightheadedness, decreased appetite and weight loss.    Medications and allergies reviewed with patient and updated if appropriate.  Patient Active Problem List   Diagnosis Date Noted  . Facial rash 03/17/2016  . Numbness of fingers of both hands 02/07/2015  . Attention deficit disorder 09/01/2006    No current outpatient prescriptions on file prior to visit.   No current facility-administered medications on file prior to visit.     Past Medical History:  Diagnosis Date  . 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 Gonorrhea 2009    Past Surgical History:  Procedure Laterality Date  . LAPAROSCOPY  10/13/2010   Procedure: LAPAROSCOPY OPERATIVE;  Surgeon: Sherron Monday, MD;  Location: [facility];  Service: Gynecology;  Laterality: N/A;  Lysis of Adhesions. Right Salpingooophrectomy,Evacuation of Hematoperitoneum  . RIGHT OOPHORECTOMY  11/13/2010   tubal pregnancy  . TONSILLECTOMY    . WISDOM TOOTH EXTRACTION      Social History   Social History  . Marital status: Single    Spouse name: N/A  . Number of children: N/A  . Years of education: N/A   Social History Main Topics  . Smoking status: Current Every Day Smoker    Packs/day: 0.50  . Smokeless tobacco: Never Used     Comment: smoked 1999- present , up to 1 ppd.. 09/10/13 : 1/2 ppd  . Alcohol use Yes     Comment: rare  . Drug use: No  . Sexual activity: Yes   Other Topics Concern  . None   Social History Narrative   Acute, no regular exercise    Family History  Problem Relation Age of Onset   . Hyperlipidemia Father   . Hypertension Father   . Hyperlipidemia Brother   . Hyperlipidemia Maternal Grandfather   . Hypertension Maternal Grandfather   . Parkinson's disease Maternal Grandfather   . Hyperlipidemia Mother   . Hyperlipidemia Maternal Grandmother   . Cancer Neg Hx   . Stroke Neg Hx   . Diabetes Neg Hx   . Heart disease Neg Hx     Review of Systems  Constitutional: Negative for appetite change, chills, fatigue and fever.  Eyes: Negative for visual disturbance.  Respiratory: Negative for cough, shortness of breath and wheezing.   Cardiovascular: Negative for chest pain, palpitations and leg swelling.  Gastrointestinal: Negative for abdominal pain, blood in stool, constipation, diarrhea and nausea.       No gerd  Genitourinary: Negative for dysuria and hematuria.  Musculoskeletal: Positive for back pain (mild, intermittent). Negative for arthralgias.  Skin: Negative for color change and rash.  Neurological: Negative for dizziness, light-headedness and headaches.  Psychiatric/Behavioral: Negative for dysphoric mood and sleep disturbance. The patient is not nervous/anxious.        Objective:   Vitals:   08/02/16 0806  BP: 136/86  Pulse: 95  Resp: 16  Temp: 98.4 F (36.9 C)   Filed Weights   08/02/16 0806  Weight: 199 lb (90.3 kg)   Body mass index  is 32.12 kg/m.  Wt Readings from Last 3 Encounters:  08/02/16 199 lb (90.3 kg)  03/17/16 197 lb (89.4 kg)  02/03/16 194 lb (88 kg)     Physical Exam Constitutional: She appears well-developed and well-nourished. No distress.  HENT:  Head: Normocephalic and atraumatic.  Right Ear: External ear normal. Normal ear canal and TM Left Ear: External ear normal.  Normal ear canal and TM Mouth/Throat: Oropharynx is clear and moist.  Eyes: Conjunctivae and EOM are normal.  Neck: Neck supple. No tracheal deviation present. No thyromegaly present.  No carotid bruit  Cardiovascular: Normal rate, regular rhythm  and normal heart sounds.   No murmur heard.  No edema. Pulmonary/Chest: Effort normal and breath sounds normal. No respiratory distress. She has no wheezes. She has no rales.  Breast: deferred to Gyn Abdominal: Soft. She exhibits no distension. There is no tenderness.  Lymphadenopathy: She has no cervical adenopathy.  Skin: Skin is warm and dry. She is not diaphoretic.  Psychiatric: She has a normal mood and affect. Her behavior is normal.        Assessment & Plan:   Physical exam: Screening blood work  ordered Immunizations  Flu and td up to date Gyn  - due - will schedule  Exercise - no regular exercise Weight - advised weight loss - increase exercise outside of work and decrease portions Skin   No concerns Substance abuse  Smoking cigarettes  - not ready to quit, no other substance abuse  See Problem List for Assessment and Plan of chronic medical problems.  FU  In 6 months - ADD

## 2016-08-02 ENCOUNTER — Ambulatory Visit (INDEPENDENT_AMBULATORY_CARE_PROVIDER_SITE_OTHER): Payer: BLUE CROSS/BLUE SHIELD | Admitting: Internal Medicine

## 2016-08-02 ENCOUNTER — Encounter: Payer: Self-pay | Admitting: Internal Medicine

## 2016-08-02 VITALS — BP 136/86 | HR 95 | Temp 98.4°F | Resp 16 | Ht 66.0 in | Wt 199.0 lb

## 2016-08-02 DIAGNOSIS — Z114 Encounter for screening for human immunodeficiency virus [HIV]: Secondary | ICD-10-CM | POA: Diagnosis not present

## 2016-08-02 DIAGNOSIS — Z0000 Encounter for general adult medical examination without abnormal findings: Secondary | ICD-10-CM

## 2016-08-02 DIAGNOSIS — F909 Attention-deficit hyperactivity disorder, unspecified type: Secondary | ICD-10-CM

## 2016-08-02 MED ORDER — AMPHETAMINE-DEXTROAMPHETAMINE 15 MG PO TABS: 15.0000 mg | ORAL_TABLET | Freq: Two times a day (BID) | ORAL | 0 refills | Status: DC

## 2016-08-02 NOTE — Assessment & Plan Note (Signed)
Controlled, stable Continue current dose of medication Fu in 6 months

## 2016-08-02 NOTE — Progress Notes (Signed)
Pre visit review using our clinic review tool, if applicable. No additional management support is needed unless otherwise documented below in the visit note. 

## 2016-09-21 ENCOUNTER — Telehealth: Payer: Self-pay | Admitting: Internal Medicine

## 2016-09-21 DIAGNOSIS — F909 Attention-deficit hyperactivity disorder, unspecified type: Secondary | ICD-10-CM

## 2016-09-21 MED ORDER — AMPHETAMINE-DEXTROAMPHETAMINE 15 MG PO TABS: 15.0000 mg | ORAL_TABLET | Freq: Two times a day (BID) | ORAL | 0 refills | Status: DC

## 2016-09-21 NOTE — Telephone Encounter (Signed)
[place] Controlled Substance Database checked. Okay to fill RX. Last filled on 08/22/16

## 2016-09-21 NOTE — Telephone Encounter (Signed)
Pt would like a refill of amphetamine-dextroamphetamine (ADDERALL) 15 MG tablet

## 2016-09-21 NOTE — Telephone Encounter (Signed)
VM informing pt RX is read for pick-up

## 2016-09-21 NOTE — Telephone Encounter (Signed)
Noted. rx signed. 

## 2016-10-19 ENCOUNTER — Telehealth: Payer: Self-pay | Admitting: Internal Medicine

## 2016-10-19 DIAGNOSIS — F909 Attention-deficit hyperactivity disorder, unspecified type: Secondary | ICD-10-CM

## 2016-10-19 MED ORDER — AMPHETAMINE-DEXTROAMPHETAMINE 15 MG PO TABS: 15.0000 mg | ORAL_TABLET | Freq: Two times a day (BID) | ORAL | 0 refills | Status: DC

## 2016-10-19 NOTE — Telephone Encounter (Signed)
MD is out of the office this week. check [place] registry last filled 09/22/2016 @ [facility]...Raechel Chute/lmb

## 2016-10-19 NOTE — Telephone Encounter (Signed)
Pt needs a refill of amphetamine-dextroamphetamine (ADDERALL) 15 MG tablet   Last CPE 08/02/16

## 2016-10-19 NOTE — Telephone Encounter (Signed)
Notified pt rx ready for pick-up.../lmb 

## 2016-10-19 NOTE — Telephone Encounter (Signed)
Medication refilled and dated per previous refill.

## 2016-11-17 ENCOUNTER — Telehealth: Payer: Self-pay | Admitting: Internal Medicine

## 2016-11-17 DIAGNOSIS — F909 Attention-deficit hyperactivity disorder, unspecified type: Secondary | ICD-10-CM

## 2016-11-17 MED ORDER — AMPHETAMINE-DEXTROAMPHETAMINE 15 MG PO TABS: 15.0000 mg | ORAL_TABLET | Freq: Two times a day (BID) | ORAL | 0 refills | Status: DC

## 2016-11-17 NOTE — Telephone Encounter (Signed)
Ok to fill 

## 2016-11-17 NOTE — Telephone Encounter (Signed)
Check [place] registry last filled 10/20/2016.../lmb  

## 2016-11-17 NOTE — Telephone Encounter (Signed)
Called pt no answer [facility] rx ready for pick-up.../lmb 

## 2016-11-17 NOTE — Telephone Encounter (Signed)
Pt called in and needs a refill on her amphetamine-dextroamphetamine (ADDERALL) 15 MG tablet [[id number]]  Last filled 7/24

## 2016-12-20 ENCOUNTER — Telehealth: Payer: Self-pay | Admitting: Internal Medicine

## 2016-12-20 DIAGNOSIS — F909 Attention-deficit hyperactivity disorder, unspecified type: Secondary | ICD-10-CM

## 2016-12-20 MED ORDER — AMPHETAMINE-DEXTROAMPHETAMINE 15 MG PO TABS: 15.0000 mg | ORAL_TABLET | Freq: Two times a day (BID) | ORAL | 0 refills | Status: DC

## 2016-12-20 NOTE — Telephone Encounter (Signed)
Ok to fill 

## 2016-12-20 NOTE — Telephone Encounter (Signed)
Called pt no answer [facility] rx ready for pick-up.../lmb 

## 2016-12-20 NOTE — Telephone Encounter (Signed)
Check Adriana Hansen registry last filled 11/19/2016...Adriana Hansen

## 2016-12-20 NOTE — Telephone Encounter (Signed)
Pt called requesting a refill on amphetamine-dextroamphetamine (ADDERALL) 15 MG tablet.

## 2017-01-17 ENCOUNTER — Telehealth: Payer: Self-pay | Admitting: Internal Medicine

## 2017-01-17 DIAGNOSIS — F909 Attention-deficit hyperactivity disorder, unspecified type: Secondary | ICD-10-CM

## 2017-01-17 MED ORDER — AMPHETAMINE-DEXTROAMPHETAMINE 15 MG PO TABS: 15.0000 mg | ORAL_TABLET | Freq: Two times a day (BID) | ORAL | 0 refills | Status: DC

## 2017-01-17 NOTE — Telephone Encounter (Signed)
Pt called for a refill of her amphetamine-dextroamphetamine (ADDERALL) 15 MG tablet  Please advise

## 2017-01-17 NOTE — Telephone Encounter (Signed)
Ok ti fill

## 2017-01-17 NOTE — Telephone Encounter (Signed)
Check [place] registry last filled 12/20/2016.../lmb  

## 2017-01-17 NOTE — Telephone Encounter (Signed)
Called pt no answer [facility] rx ready for pick-up.../lmb 

## 2017-01-27 ENCOUNTER — Other Ambulatory Visit (INDEPENDENT_AMBULATORY_CARE_PROVIDER_SITE_OTHER): Payer: BLUE CROSS/BLUE SHIELD

## 2017-01-27 DIAGNOSIS — Z0000 Encounter for general adult medical examination without abnormal findings: Secondary | ICD-10-CM

## 2017-01-27 DIAGNOSIS — Z114 Encounter for screening for human immunodeficiency virus [HIV]: Secondary | ICD-10-CM

## 2017-01-27 LAB — COMPREHENSIVE METABOLIC PANEL
ALT: 18 U/L (ref 0–35)
AST: 15 U/L (ref 0–37)
Albumin: 4.1 g/dL (ref 3.5–5.2)
Alkaline Phosphatase: 78 U/L (ref 39–117)
BUN: 12 mg/dL (ref 6–23)
CO2: 27 meq/L (ref 19–32)
CREATININE: 0.63 mg/dL (ref 0.40–1.20)
Calcium: 9.2 mg/dL (ref 8.4–10.5)
Chloride: 107 mEq/L (ref 96–112)
GFR: 115.14 mL/min (ref >60.00)
GLUCOSE: 101 mg/dL — AB (ref 70–99)
Potassium: 4.6 mEq/L (ref 3.5–5.1)
SODIUM: 140 meq/L (ref 135–145)
Total Bilirubin: 0.6 mg/dL (ref 0.2–1.2)
Total Protein: 6.8 g/dL (ref 6.0–8.3)

## 2017-01-27 LAB — LIPID PANEL
CHOL/HDL RATIO: 4
CHOLESTEROL: 159 mg/dL (ref 0–200)
HDL: 41.1 mg/dL (ref >39.00)
LDL Cholesterol: 103 mg/dL — ABNORMAL HIGH (ref 0–99)
NonHDL: 118.28
Triglycerides: 76 mg/dL (ref 0.0–149.0)
VLDL: 15.2 mg/dL (ref 0.0–40.0)

## 2017-01-27 LAB — CBC WITH DIFFERENTIAL/PLATELET
BASOS ABS: 0.1 10*3/uL (ref 0.0–0.1)
Basophils Relative: 0.8 % (ref 0.0–3.0)
Eosinophils Absolute: 0.1 10*3/uL (ref 0.0–0.7)
Eosinophils Relative: 1.3 % (ref 0.0–5.0)
HCT: 40.6 % (ref 36.0–46.0)
Hemoglobin: 13.5 g/dL (ref 12.0–15.0)
LYMPHS ABS: 3.2 10*3/uL (ref 0.7–4.0)
LYMPHS PCT: 29 % (ref 12.0–46.0)
MCHC: 33.2 g/dL (ref 30.0–36.0)
MCV: 88.3 fl (ref 78.0–100.0)
Monocytes Absolute: 1.1 10*3/uL — ABNORMAL HIGH (ref 0.1–1.0)
Monocytes Relative: 9.8 % (ref 3.0–12.0)
NEUTROS ABS: 6.6 10*3/uL (ref 1.4–7.7)
NEUTROS PCT: 59.1 % (ref 43.0–77.0)
PLATELETS: 448 10*3/uL — AB (ref 150.0–400.0)
RBC: 4.6 Mil/uL (ref 3.87–5.11)
RDW: 13.6 % (ref 11.5–15.5)
WBC: 11.1 10*3/uL — ABNORMAL HIGH (ref 4.0–10.5)

## 2017-01-27 LAB — TSH: TSH: 2.61 u[IU]/mL (ref 0.35–4.50)

## 2017-01-28 LAB — HIV ANTIBODY (ROUTINE TESTING W REFLEX): HIV 1&2 Ab, 4th Generation: NONREACTIVE

## 2017-02-01 NOTE — Progress Notes (Signed)
Subjective:    Patient ID: Adriana Hansen, female    DOB: 18-Apr-1985, 31 y.o.   MRN: [id number]  HPI The patient is here for follow up.  ADD:  She is taking her medication as prescribed.  She feels the medication is effective.  She denies side effects, including palpitations, headaches, lightheadedness, decreased appetite and weight loss.      Medications and allergies reviewed with patient and updated if appropriate.  Patient Active Problem List   Diagnosis Date Noted  . Facial rash 03/17/2016  . Numbness of fingers of both hands 02/07/2015  . Attention deficit disorder 09/01/2006    Current Outpatient Medications on File Prior to Visit  Medication Sig Dispense Refill  . amphetamine-dextroamphetamine (ADDERALL) 15 MG tablet Take 1 tablet by mouth 2 (two) times daily. 60 tablet 0   No current facility-administered medications on file prior to visit.     Past Medical History:  Diagnosis Date  . 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 Gonorrhea 2009    Past Surgical History:  Procedure Laterality Date  . RIGHT OOPHORECTOMY  11/13/2010   tubal pregnancy  . TONSILLECTOMY    . WISDOM TOOTH EXTRACTION      Social History   Socioeconomic History  . Marital status: Single    Spouse name: Not on file  . Number of children: Not on file  . Years of education: Not on file  . Highest education level: Not on file  Social Needs  . Financial resource strain: Not on file  . Food insecurity - worry: Not on file  . Food insecurity - inability: Not on file  . Transportation needs - medical: Not on file  . Transportation needs - non-medical: Not on file  Occupational History  . Not on file  Tobacco Use  . Smoking status: Current Every Day Smoker    Packs/day: 0.50  . Smokeless tobacco: Never Used  . Tobacco comment: smoked 1999- present , up to 1 ppd.. 09/10/13 : 1/2 ppd  Substance and Sexual Activity  . Alcohol use: Yes   Comment: rare  . Drug use: No  . Sexual activity: Yes  Other Topics Concern  . Not on file  Social History Narrative   Acute, no regular exercise    Family History  Problem Relation Age of Onset  . Hyperlipidemia Father   . Hypertension Father   . Hyperlipidemia Brother   . Hyperlipidemia Maternal Grandfather   . Hypertension Maternal Grandfather   . Parkinson's disease Maternal Grandfather   . Hyperlipidemia Mother   . Hyperlipidemia Maternal Grandmother   . Cancer Neg Hx   . Stroke Neg Hx   . Diabetes Neg Hx   . Heart disease Neg Hx     Review of Systems  Constitutional: Negative for appetite change and unexpected weight change.  Respiratory: Negative for shortness of breath.   Cardiovascular: Negative for chest pain and palpitations.  Neurological: Negative for light-headedness and headaches.       Objective:   Vitals:   02/02/17 0835  BP: 130/82  Pulse: (!) 105  Resp: 16  Temp: 98.5 F (36.9 C)  SpO2: 99%   Wt Readings from Last 3 Encounters:  02/02/17 205 lb (93 kg)  08/02/16 199 lb (90.3 kg)  03/17/16 197 lb (89.4 kg)   Body mass index is 33.09 kg/m.   Physical Exam    Constitutional: Appears well-developed and well-nourished. No distress.  HENT:  Head:  Normocephalic and atraumatic.  Neck: Neck supple. No tracheal deviation present. No thyromegaly present.  No cervical lymphadenopathy Cardiovascular: Normal rate, regular rhythm and normal heart sounds.   No murmur heard.   No edema Pulmonary/Chest: Effort normal and breath sounds normal. No respiratory distress. No has no wheezes. No rales.  Skin: Skin is warm and dry. Not diaphoretic.  Psychiatric: Normal mood and affect. Behavior is normal.      Assessment & Plan:    See Problem List for Assessment and Plan of chronic medical problems.   FU in 6 months

## 2017-02-01 NOTE — Patient Instructions (Addendum)
  No immunizations administered today.   Medications reviewed and updated.  No changes recommended at this time.   Please followup in 6 months for a physical   

## 2017-02-02 ENCOUNTER — Encounter: Payer: Self-pay | Admitting: Internal Medicine

## 2017-02-02 ENCOUNTER — Ambulatory Visit: Payer: BLUE CROSS/BLUE SHIELD | Admitting: Internal Medicine

## 2017-02-02 VITALS — BP 130/82 | HR 105 | Temp 98.5°F | Resp 16 | Wt 205.0 lb

## 2017-02-02 DIAGNOSIS — E669 Obesity, unspecified: Secondary | ICD-10-CM | POA: Insufficient documentation

## 2017-02-02 DIAGNOSIS — F988 Other specified behavioral and emotional disorders with onset usually occurring in childhood and adolescence: Secondary | ICD-10-CM

## 2017-02-02 MED ORDER — AMPHETAMINE-DEXTROAMPHETAMINE 15 MG PO TABS: 15.0000 mg | ORAL_TABLET | Freq: Two times a day (BID) | ORAL | 0 refills | Status: DC

## 2017-02-02 NOTE — Assessment & Plan Note (Signed)
[facility] substance database checked - Adderall last filled 01/18/17 - will give rx for his month with date of when it can be filled No side effects Med still effective Continue at current dose F/u in 6 months

## 2017-03-16 ENCOUNTER — Telehealth: Payer: Self-pay | Admitting: Internal Medicine

## 2017-03-16 DIAGNOSIS — F988 Other specified behavioral and emotional disorders with onset usually occurring in childhood and adolescence: Secondary | ICD-10-CM

## 2017-03-16 NOTE — Telephone Encounter (Signed)
Copied from CRM #[id number]. Topic: Inquiry >> Mar 16, 2017 12:31 PM Windy KalataMichael, Taylor L, NT wrote: Reason for CRM: patient is calling to inform the office she has contacted her pharmacy and sent a fax request over for adderall

## 2017-03-17 MED ORDER — AMPHETAMINE-DEXTROAMPHETAMINE 15 MG PO TABS: 15.0000 mg | ORAL_TABLET | Freq: Two times a day (BID) | ORAL | 0 refills | Status: DC

## 2017-04-20 ENCOUNTER — Telehealth: Payer: Self-pay | Admitting: Internal Medicine

## 2017-04-20 DIAGNOSIS — F988 Other specified behavioral and emotional disorders with onset usually occurring in childhood and adolescence: Secondary | ICD-10-CM

## 2017-04-20 MED ORDER — AMPHETAMINE-DEXTROAMPHETAMINE 15 MG PO TABS: 15.0000 mg | ORAL_TABLET | Freq: Two times a day (BID) | ORAL | 0 refills | Status: DC

## 2017-04-20 NOTE — Telephone Encounter (Signed)
Copied from CRM [phone]. Topic: Quick Communication - Rx Refill/Question >> Apr 20, 2017  3:15 PM Laural BenesJohnson, Louisianahiquita C wrote:    Medication: Adderall    Has the patient contacted their pharmacy? no   (Agent: If no, request that the patient contact the pharmacy for the refill.)   Preferred Pharmacy (with phone number or street name): [facility] [phone] - [place], [place] - [address]   Agent: Please be advised that RX refills may take up to 3 business days. We ask that you follow-up with your pharmacy.

## 2017-04-21 NOTE — Telephone Encounter (Signed)
Called pt no answer LMOM MD [facility] to [facility]../;lmb 

## 2017-05-19 ENCOUNTER — Other Ambulatory Visit: Payer: Self-pay | Admitting: Internal Medicine

## 2017-05-19 DIAGNOSIS — F988 Other specified behavioral and emotional disorders with onset usually occurring in childhood and adolescence: Secondary | ICD-10-CM

## 2017-05-19 NOTE — Telephone Encounter (Signed)
Copied from CRM [phone]. Topic: General - Other >> May 19, 2017  4:56 PM Cecelia ByarsGreen, Temeka L, RMA wrote: Reason for CRM: Medication refill request for amphetamine-dextroamphetamine (ADDERALL) 15 [facility]

## 2017-05-20 MED ORDER — AMPHETAMINE-DEXTROAMPHETAMINE 15 MG PO TABS
15.0000 mg | ORAL_TABLET | Freq: Two times a day (BID) | ORAL | 0 refills | Status: DC
Start: 2017-05-20 — End: 2017-06-20

## 2017-05-20 NOTE — Telephone Encounter (Signed)
[place] Controlled Substance Database checked. Last filled on 04/20/17 

## 2017-06-20 ENCOUNTER — Other Ambulatory Visit: Payer: Self-pay | Admitting: Internal Medicine

## 2017-06-20 DIAGNOSIS — F988 Other specified behavioral and emotional disorders with onset usually occurring in childhood and adolescence: Secondary | ICD-10-CM

## 2017-06-20 NOTE — Telephone Encounter (Signed)
[place] Controlled Substance Database checked. Last filled on 05/20/17 

## 2017-06-21 MED ORDER — AMPHETAMINE-DEXTROAMPHETAMINE 15 MG PO TABS: 15.0000 mg | ORAL_TABLET | Freq: Two times a day (BID) | ORAL | 0 refills | Status: DC

## 2017-07-21 ENCOUNTER — Other Ambulatory Visit: Payer: Self-pay | Admitting: Internal Medicine

## 2017-07-21 DIAGNOSIS — F988 Other specified behavioral and emotional disorders with onset usually occurring in childhood and adolescence: Secondary | ICD-10-CM

## 2017-07-22 ENCOUNTER — Other Ambulatory Visit: Payer: BLUE CROSS/BLUE SHIELD

## 2017-07-22 DIAGNOSIS — F988 Other specified behavioral and emotional disorders with onset usually occurring in childhood and adolescence: Secondary | ICD-10-CM | POA: Diagnosis not present

## 2017-07-22 MED ORDER — AMPHETAMINE-DEXTROAMPHETAMINE 15 MG PO TABS: 15.0000 mg | ORAL_TABLET | Freq: Two times a day (BID) | ORAL | 0 refills | Status: DC

## 2017-07-22 NOTE — Telephone Encounter (Signed)
Please advise per Dr. Lawerance BachBurns absence  Control database checked last refill: 06/23/2017 LOV: 02/02/2017

## 2017-07-22 NOTE — Telephone Encounter (Signed)
Needs UDS prior to fill. Call for sample and route back once done for refill

## 2017-07-22 NOTE — Telephone Encounter (Signed)
LVM for patient to come to the lab for UDS and refill will be sent

## 2017-07-25 LAB — PAIN MGMT, PROFILE 8 W/CONF, U
6 Acetylmorphine: NEGATIVE ng/mL (ref <10)
ALCOHOL METABOLITES: NEGATIVE ng/mL (ref <500)
AMPHETAMINES: POSITIVE ng/mL — AB (ref <500)
Amphetamine: 15264 ng/mL — ABNORMAL HIGH (ref <250)
BENZODIAZEPINES: NEGATIVE ng/mL (ref <100)
BUPRENORPHINE, URINE: NEGATIVE ng/mL (ref <5)
COCAINE METABOLITE: NEGATIVE ng/mL (ref <150)
CREATININE: 232.5 mg/dL
ETHYL GLUCURONIDE (ETG): NEGATIVE ng/mL (ref <500)
ETHYL SULFATE (ETS): NEGATIVE ng/mL (ref <100)
MDMA: NEGATIVE ng/mL (ref <500)
Marijuana Metabolite: NEGATIVE ng/mL (ref <20)
Methamphetamine: NEGATIVE ng/mL (ref <250)
OXIDANT: NEGATIVE ug/mL (ref <200)
OXYCODONE: NEGATIVE ng/mL (ref <100)
Opiates: NEGATIVE ng/mL (ref <100)
PH: 6.97 (ref 4.5–9.0)

## 2017-08-01 NOTE — Patient Instructions (Addendum)
All other Health Maintenance issues reviewed.   All recommended immunizations and age-appropriate screenings are up-to-date or discussed.  No immunizations administered today.   Medications reviewed and updated.  No changes recommended at this time.    Please followup in 6 months   Health Maintenance, Female Adopting a healthy lifestyle and getting preventive care can go a long way to promote health and wellness. Talk with your health care provider about what schedule of regular examinations is right for you. This is a good chance for you to check in with your provider about disease prevention and staying healthy. In between checkups, there are plenty of things you can do on your own. Experts have done a lot of research about which lifestyle changes and preventive measures are most likely to keep you healthy. Ask your health care provider for more information. Weight and diet Eat a healthy diet  Be sure to include plenty of vegetables, fruits, low-fat dairy products, and lean protein.  Do not eat a lot of foods high in solid fats, added sugars, or salt.  Get regular exercise. This is one of the most important things you can do for your health. ? Most adults should exercise for at least 150 minutes each week. The exercise should increase your heart rate and make you sweat (moderate-intensity exercise). ? Most adults should also do strengthening exercises at least twice a week. This is in addition to the moderate-intensity exercise.  Maintain a healthy weight  Body mass index (BMI) is a measurement that can be used to identify possible weight problems. It estimates body fat based on height and weight. Your health care provider can help determine your BMI and help you achieve or maintain a healthy weight.  For females 52 years of age and older: ? A BMI below 18.5 is considered underweight. ? A BMI of 18.5 to 24.9 is normal. ? A BMI of 25 to 29.9 is considered overweight. ? A BMI of 30  and above is considered obese.  Watch levels of cholesterol and blood lipids  You should start having your blood tested for lipids and cholesterol at 34 years of age, then have this test every 5 years.  You may need to have your cholesterol levels checked more often if: ? Your lipid or cholesterol levels are high. ? You are older than 34 years of age. ? You are at high risk for heart disease.  Cancer screening Lung Cancer  Lung cancer screening is recommended for adults 81-57 years old who are at high risk for lung cancer because of a history of smoking.  A yearly low-dose CT scan of the lungs is recommended for people who: ? Currently smoke. ? Have quit within the past 15 years. ? Have at least a 30-pack-year history of smoking. A pack year is smoking an average of one pack of cigarettes a day for 1 year.  Yearly screening should continue until it has been 15 years since you quit.  Yearly screening should stop if you develop a health problem that would prevent you from having lung cancer treatment.  Breast Cancer  Practice breast self-awareness. This means understanding how your breasts normally appear and feel.  It also means doing regular breast self-exams. Let your health care provider know about any changes, no matter how small.  If you are in your 20s or 30s, you should have a clinical breast exam (CBE) by a health care provider every 1-3 years as part of a regular health exam.  If you are 40 or older, have a CBE every year. Also consider having a breast X-ray (mammogram) every year.  If you have a family history of breast cancer, talk to your health care provider about genetic screening.  If you are at high risk for breast cancer, talk to your health care provider about having an MRI and a mammogram every year.  Breast cancer gene (BRCA) assessment is recommended for women who have family members with BRCA-related cancers. BRCA-related cancers  include: ? Breast. ? Ovarian. ? Tubal. ? Peritoneal cancers.  Results of the assessment will determine the need for genetic counseling and BRCA1 and BRCA2 testing.  Cervical Cancer Your health care provider may recommend that you be screened regularly for cancer of the pelvic organs (ovaries, uterus, and vagina). This screening involves a pelvic examination, including checking for microscopic changes to the surface of your cervix (Pap test). You may be encouraged to have this screening done every 3 years, beginning at age 21.  For women ages 30-65, health care providers may recommend pelvic exams and Pap testing every 3 years, or they may recommend the Pap and pelvic exam, combined with testing for human papilloma virus (HPV), every 5 years. Some types of HPV increase your risk of cervical cancer. Testing for HPV may also be done on women of any age with unclear Pap test results.  Other health care providers may not recommend any screening for nonpregnant women who are considered low risk for pelvic cancer and who do not have symptoms. Ask your health care provider if a screening pelvic exam is right for you.  If you have had past treatment for cervical cancer or a condition that could lead to cancer, you need Pap tests and screening for cancer for at least 20 years after your treatment. If Pap tests have been discontinued, your risk factors (such as having a new sexual partner) need to be reassessed to determine if screening should resume. Some women have medical problems that increase the chance of getting cervical cancer. In these cases, your health care provider may recommend more frequent screening and Pap tests.  Colorectal Cancer  This type of cancer can be detected and often prevented.  Routine colorectal cancer screening usually begins at 34 years of age and continues through 34 years of age.  Your health care provider may recommend screening at an earlier age if you have risk factors  for colon cancer.  Your health care provider may also recommend using home test kits to check for hidden blood in the stool.  A small camera at the end of a tube can be used to examine your colon directly (sigmoidoscopy or colonoscopy). This is done to check for the earliest forms of colorectal cancer.  Routine screening usually begins at age 50.  Direct examination of the colon should be repeated every 5-10 years through 34 years of age. However, you may need to be screened more often if early forms of precancerous polyps or small growths are found.  Skin Cancer  Check your skin from head to toe regularly.  Tell your health care provider about any new moles or changes in moles, especially if there is a change in a mole's shape or color.  Also tell your health care provider if you have a mole that is larger than the size of a pencil eraser.  Always use sunscreen. Apply sunscreen liberally and repeatedly throughout the day.  Protect yourself by wearing long sleeves, pants, a wide-brimmed hat, and   sunglasses whenever you are outside.  Heart disease, diabetes, and high blood pressure  High blood pressure causes heart disease and increases the risk of stroke. High blood pressure is more likely to develop in: ? People who have blood pressure in the high end of the normal range (130-139/85-89 mm Hg). ? People who are overweight or obese. ? People who are African American.  If you are 25-70 years of age, have your blood pressure checked every 3-5 years. If you are 86 years of age or older, have your blood pressure checked every year. You should have your blood pressure measured twice-once when you are at a hospital or clinic, and once when you are not at a hospital or clinic. Record the average of the two measurements. To check your blood pressure when you are not at a hospital or clinic, you can use: ? An automated blood pressure machine at a pharmacy. ? A home blood pressure monitor.  If  you are between 34 years and 33 years old, ask your health care provider if you should take aspirin to prevent strokes.  Have regular diabetes screenings. This involves taking a blood sample to check your fasting blood sugar level. ? If you are at a normal weight and have a low risk for diabetes, have this test once every three years after 34 years of age. ? If you are overweight and have a high risk for diabetes, consider being tested at a younger age or more often. Preventing infection Hepatitis B  If you have a higher risk for hepatitis B, you should be screened for this virus. You are considered at high risk for hepatitis B if: ? You were born in a country where hepatitis B is common. Ask your health care provider which countries are considered high risk. ? Your parents were born in a high-risk country, and you have not been immunized against hepatitis B (hepatitis B vaccine). ? You have HIV or AIDS. ? You use needles to inject street drugs. ? You live with someone who has hepatitis B. ? You have had sex with someone who has hepatitis B. ? You get hemodialysis treatment. ? You take certain medicines for conditions, including cancer, organ transplantation, and autoimmune conditions.  Hepatitis C  Blood testing is recommended for: ? Everyone born from [birth year] through 1967. ? Anyone with known risk factors for hepatitis C.  Sexually transmitted infections (STIs)  You should be screened for sexually transmitted infections (STIs) including gonorrhea and chlamydia if: ? You are sexually active and are younger than 34 years of age. ? You are older than 34 years of age and your health care provider tells you that you are at risk for this type of infection. ? Your sexual activity has changed since you were last screened and you are at an increased risk for chlamydia or gonorrhea. Ask your health care provider if you are at risk.  If you do not have HIV, but are at risk, it may be recommended  that you take a prescription medicine daily to prevent HIV infection. This is called pre-exposure prophylaxis (PrEP). You are considered at risk if: ? You are sexually active and do not regularly use condoms or know the HIV status of your partner(s). ? You take drugs by injection. ? You are sexually active with a partner who has HIV.  Talk with your health care provider about whether you are at high risk of being infected with HIV. If you choose to begin PrEP, you  should first be tested for HIV. You should then be tested every 3 months for as long as you are taking PrEP. Pregnancy  If you are premenopausal and you may become pregnant, ask your health care provider about preconception counseling.  If you may become pregnant, take 400 to 800 micrograms (mcg) of folic acid every day.  If you want to prevent pregnancy, talk to your health care provider about birth control (contraception). Osteoporosis and menopause  Osteoporosis is a disease in which the bones lose minerals and strength with aging. This can result in serious bone fractures. Your risk for osteoporosis can be identified using a bone density scan.  If you are 65 years of age or older, or if you are at risk for osteoporosis and fractures, ask your health care provider if you should be screened.  Ask your health care provider whether you should take a calcium or vitamin D supplement to lower your risk for osteoporosis.  Menopause may have certain physical symptoms and risks.  Hormone replacement therapy may reduce some of these symptoms and risks. Talk to your health care provider about whether hormone replacement therapy is right for you. Follow these instructions at home:  Schedule regular health, dental, and eye exams.  Stay current with your immunizations.  Do not use any tobacco products including cigarettes, chewing tobacco, or electronic cigarettes.  If you are pregnant, do not drink alcohol.  If you are  breastfeeding, limit how much and how often you drink alcohol.  Limit alcohol intake to no more than 1 drink per day for nonpregnant women. One drink equals 12 ounces of beer, 5 ounces of wine, or 1 ounces of hard liquor.  Do not use street drugs.  Do not share needles.  Ask your health care provider for help if you need support or information about quitting drugs.  Tell your health care provider if you often feel depressed.  Tell your health care provider if you have ever been abused or do not feel safe at home. This information is not intended to replace advice given to you by your health care provider. Make sure you discuss any questions you have with your health care provider. Document Released: 09/28/2010 Document Revised: 08/21/2015 Document Reviewed: 12/17/2014 [employer] Interactive Patient Education  2018 [employer]  

## 2017-08-01 NOTE — Progress Notes (Signed)
Subjective:    Patient ID: Adriana Hansen, female    DOB: [birth year], 32 y.o.   MRN: [id number]  HPI She is here for a physical exam.    She has no concerns.  She denies changes in her health since she was here last.    Medications and allergies reviewed with patient and updated if appropriate.  Patient Active Problem List   Diagnosis Date Noted  . Obesity (BMI 30.0-34.9) 02/02/2017  . Facial rash 03/17/2016  . Numbness of fingers of both hands 02/07/2015  . Attention deficit disorder 09/01/2006    Current Outpatient Medications on File Prior to Visit  Medication Sig Dispense Refill  . amphetamine-dextroamphetamine (ADDERALL) 15 MG tablet Take 1 tablet by mouth 2 (two) times daily. 60 tablet 0   No current facility-administered medications on file prior to visit.     Past Medical History:  Diagnosis Date  . 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 Gonorrhea 2009    Past Surgical History:  Procedure Laterality Date  . LAPAROSCOPY  10/13/2010   Procedure: LAPAROSCOPY OPERATIVE;  Surgeon: Sherron Monday, MD;  Location: [facility];  Service: Gynecology;  Laterality: N/A;  Lysis of Adhesions. Right Salpingooophrectomy,Evacuation of Hematoperitoneum  . RIGHT OOPHORECTOMY  11/13/2010   tubal pregnancy  . TONSILLECTOMY    . WISDOM TOOTH EXTRACTION      Social History   Socioeconomic History  . Marital status: Single    Spouse name: Not on file  . Number of children: Not on file  . Years of education: Not on file  . Highest education level: Not on file  Occupational History  . Not on file  Social Needs  . Financial resource strain: Not on file  . Food insecurity:    Worry: Not on file    Inability: Not on file  . Transportation needs:    Medical: Not on file    Non-medical: Not on file  Tobacco Use  . Smoking status: Current Every Day Smoker    Packs/day: 0.50  . Smokeless tobacco: Never Used  . Tobacco comment: smoked  1999- present , up to 1 ppd.. 09/10/13 : 1/2 ppd  Substance and Sexual Activity  . Alcohol use: Yes    Comment: rare  . Drug use: No  . Sexual activity: Yes  Lifestyle  . Physical activity:    Days per week: Not on file    Minutes per session: Not on file  . Stress: Not on file  Relationships  . Social connections:    Talks on phone: Not on file    Gets together: Not on file    Attends religious service: Not on file    Active member of club or organization: Not on file    Attends meetings of clubs or organizations: Not on file    Relationship status: Not on file  Other Topics Concern  . Not on file  Social History Narrative   Acute, no regular exercise    Family History  Problem Relation Age of Onset  . Hyperlipidemia Father   . Hypertension Father   . Hyperlipidemia Brother   . Hyperlipidemia Maternal Grandfather   . Hypertension Maternal Grandfather   . Parkinson's disease Maternal Grandfather   . Hyperlipidemia Mother   . Hyperlipidemia Maternal Grandmother   . Cancer Neg Hx   . Stroke Neg Hx   . Diabetes Neg Hx   . Heart disease Neg Hx  Review of Systems  Constitutional: Negative for chills and fever.  Eyes: Negative for visual disturbance.  Respiratory: Negative for cough, shortness of breath and wheezing.   Cardiovascular: Negative for chest pain, palpitations and leg swelling.  Gastrointestinal: Negative for abdominal pain, blood in stool, constipation, diarrhea and nausea.       No gerd  Genitourinary: Negative for dysuria and hematuria.  Musculoskeletal: Positive for back pain (lower right back pain - chronic). Negative for arthralgias.  Skin: Negative for color change and rash.  Neurological: Negative for dizziness, light-headedness and headaches.  Psychiatric/Behavioral: Positive for decreased concentration. Negative for dysphoric mood. The patient is not nervous/anxious.        Objective:   Vitals:   08/02/17 0836  BP: 134/80  Pulse: (!) 108    Resp: 16  Temp: 98.2 F (36.8 C)  SpO2: 98%   Filed Weights   08/02/17 0836  Weight: 205 lb (93 kg)   Body mass index is 33.09 kg/m.  Wt Readings from Last 3 Encounters:  08/02/17 205 lb (93 kg)  02/02/17 205 lb (93 kg)  08/02/16 199 lb (90.3 kg)     Physical Exam Constitutional: She appears well-developed and well-nourished. No distress.  HENT:  Head: Normocephalic and atraumatic.  Right Ear: External ear normal. Normal ear canal and TM Left Ear: External ear normal.  Normal ear canal and TM Mouth/Throat: Oropharynx is clear and moist.  Eyes: Conjunctivae and EOM are normal.  Neck: Neck supple. No tracheal deviation present. No thyromegaly present.  No carotid bruit  Cardiovascular: Normal rate, regular rhythm and normal heart sounds.   No murmur heard.  No edema. Pulmonary/Chest: Effort normal and breath sounds normal. No respiratory distress. She has no wheezes. She has no rales.  Breast: deferred to Gyn Abdominal: Soft. She exhibits no distension. There is no tenderness.  Lymphadenopathy: She has no cervical adenopathy.  Skin: Skin is warm and dry. She is not diaphoretic.  Thickened toes nails b/l with curvature of toenails on toes 2-3 b/l Psychiatric: She has a normal mood and affect. Her behavior is normal.        Assessment & Plan:   Physical exam: Screening blood work   deferred Immunizations    Up to date  Gyn  Due - she is scheduling Exercise   - walking some - just started - stressed regular exercise Weight   Advised weight loss Skin   No concerns Substance abuse  - smoking - wants to quit - 1/2 ppd, no excessive alcohol use, discussed smoking cessation at length  See Problem List for Assessment and Plan of chronic medical problems.

## 2017-08-02 ENCOUNTER — Encounter: Payer: Self-pay | Admitting: Internal Medicine

## 2017-08-02 ENCOUNTER — Ambulatory Visit (INDEPENDENT_AMBULATORY_CARE_PROVIDER_SITE_OTHER): Payer: BLUE CROSS/BLUE SHIELD | Admitting: Internal Medicine

## 2017-08-02 VITALS — BP 134/80 | HR 108 | Temp 98.2°F | Resp 16 | Wt 205.0 lb

## 2017-08-02 DIAGNOSIS — F988 Other specified behavioral and emotional disorders with onset usually occurring in childhood and adolescence: Secondary | ICD-10-CM

## 2017-08-02 DIAGNOSIS — Z0000 Encounter for general adult medical examination without abnormal findings: Secondary | ICD-10-CM

## 2017-08-02 DIAGNOSIS — F17290 Nicotine dependence, other tobacco product, uncomplicated: Secondary | ICD-10-CM | POA: Insufficient documentation

## 2017-08-02 DIAGNOSIS — E669 Obesity, unspecified: Secondary | ICD-10-CM | POA: Diagnosis not present

## 2017-08-02 DIAGNOSIS — Z720 Tobacco use: Secondary | ICD-10-CM | POA: Diagnosis not present

## 2017-08-02 DIAGNOSIS — L608 Other nail disorders: Secondary | ICD-10-CM | POA: Diagnosis not present

## 2017-08-02 DIAGNOSIS — F17210 Nicotine dependence, cigarettes, uncomplicated: Secondary | ICD-10-CM | POA: Insufficient documentation

## 2017-08-02 NOTE — Assessment & Plan Note (Signed)
Referred to podiatry.

## 2017-08-02 NOTE — Assessment & Plan Note (Signed)
Advised weight loss - she does want to lose weight Increase exercise Healthy diet

## 2017-08-02 NOTE — Assessment & Plan Note (Addendum)
Controlled, stable Continue current dose of medication FU in 6 months

## 2017-08-02 NOTE — Assessment & Plan Note (Signed)
Smoking cessation was discussed for more than 3 minutes.  The patient was counseled on the dangers of tobacco use, and was advised to quit.  Reviewed ways of quitting smoking including nicotine replacement, cold [place], weaning off cigarettes, and pharmacotherapy (wellbutrin and chantix).  She does want to quit, but is not 100% committed to quitting.

## 2017-08-22 ENCOUNTER — Other Ambulatory Visit: Payer: Self-pay | Admitting: Internal Medicine

## 2017-08-22 DIAGNOSIS — F988 Other specified behavioral and emotional disorders with onset usually occurring in childhood and adolescence: Secondary | ICD-10-CM

## 2017-08-23 MED ORDER — AMPHETAMINE-DEXTROAMPHETAMINE 15 MG PO TABS: 15.0000 mg | ORAL_TABLET | Freq: Two times a day (BID) | ORAL | 0 refills | Status: DC

## 2017-08-23 NOTE — Telephone Encounter (Signed)
[place] Controlled Substance Database checked. Last filled on 07/23/17

## 2017-08-29 ENCOUNTER — Encounter: Payer: Self-pay | Admitting: Internal Medicine

## 2017-08-29 ENCOUNTER — Ambulatory Visit: Payer: Self-pay | Admitting: *Deleted

## 2017-08-29 ENCOUNTER — Ambulatory Visit (INDEPENDENT_AMBULATORY_CARE_PROVIDER_SITE_OTHER): Payer: BLUE CROSS/BLUE SHIELD | Admitting: Internal Medicine

## 2017-08-29 DIAGNOSIS — L259 Unspecified contact dermatitis, unspecified cause: Secondary | ICD-10-CM | POA: Diagnosis not present

## 2017-08-29 MED ORDER — TRIAMCINOLONE ACETONIDE 0.1 % EX CREA: 1.0000 "application " | TOPICAL_CREAM | Freq: Two times a day (BID) | CUTANEOUS | 0 refills | Status: DC

## 2017-08-29 MED ORDER — METHYLPREDNISOLONE ACETATE 80 MG/ML IJ SUSP
80.0000 mg | Freq: Once | INTRAMUSCULAR | Status: AC
Start: 2017-08-29 — End: 2017-08-29
  Administered 2017-08-29: 80 mg via INTRAMUSCULAR

## 2017-08-29 NOTE — Progress Notes (Signed)
   Subjective:    Patient ID: Adriana Hansen, female    DOB: 12/01/1985, 32 y.o.   MRN: [id number]  HPI  Here with acute onset rash since working on the deck cleaning yesterday; not sure what she may have come into contact with, but now with intense itchy multiple slight raised erythem lesions to left arm just above and below the elbow anteromedially.  No fever, trauma or other new complaints.  Zyrtec and topical benadryl no help.  Just trying to not scratch but not working well Past Medical History:  Diagnosis Date  . 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 Gonorrhea 2009   Past Surgical History:  Procedure Laterality Date  . LAPAROSCOPY  10/13/2010   Procedure: LAPAROSCOPY OPERATIVE;  Surgeon: Sherron MondayJody Bovard, MD;  Location: [facility];  Service: Gynecology;  Laterality: N/A;  Lysis of Adhesions. Right Salpingooophrectomy,Evacuation of Hematoperitoneum  . RIGHT OOPHORECTOMY  11/13/2010   tubal pregnancy  . TONSILLECTOMY    . WISDOM TOOTH EXTRACTION      reports that she has been smoking.  She has been smoking about 0.50 packs per day. She has never used smokeless tobacco. She reports that she drinks alcohol. She reports that she does not use drugs. family history includes Hyperlipidemia in her brother, father, maternal grandfather, maternal grandmother, and mother; Hypertension in her father and maternal grandfather; Parkinson's disease in her maternal grandfather. No Known Allergies Current Outpatient Medications on File Prior to Visit  Medication Sig Dispense Refill  . amphetamine-dextroamphetamine (ADDERALL) 15 MG tablet Take 1 tablet by mouth 2 (two) times daily. 60 tablet 0   No current facility-administered medications on file prior to visit.    Review of Systems All otherwise neg per pt    Objective:   Physical Exam BP 134/84   Pulse 100   Temp 98.2 F (36.8 C) (Oral)   Ht 5\' 6"  (1.676 m)   Wt 207 lb (93.9 kg)   SpO2 98%   BMI 33.41  kg/m  VS noted,  Constitutional: Pt appears in NAD HENT: Head: NCAT.  Right Ear: External ear normal.  Left Ear: External ear normal.  Eyes: . Pupils are equal, round, and reactive to light. Conjunctivae and EOM are normal Nose: without d/c or deformity Neck: Neck supple. Gross normal ROM Cardiovascular: Normal rate and regular rhythm.   Pulmonary/Chest: Effort normal and breath sounds without rales or wheezing.  Abd:  Soft, NT, ND, + BS, no organomegaly Neurological: Pt is alert. At baseline orientation, motor grossly intact Skin: Skin is warm. + rashes multiple slight raised erythem lesions to left arm just above and below the elbow anteromedially , no other new lesions, no LE edema Psychiatric: Pt behavior is normal without agitation  No other exam findings    Assessment & Plan:

## 2017-08-29 NOTE — Assessment & Plan Note (Signed)
Etiology unclear, but likely contact for some reason, ok for depomedrol IM 80, and triam cr prn,  to f/u any worsening symptoms or concerns

## 2017-08-29 NOTE — Telephone Encounter (Signed)
Summary: rash on arm   Pt. Calling stating that a rash has appeared on her left arm (since yesterday) and is now swollen, and red, and itching      Patient was refinished a piece of furniture on the porch yesterday and noticed a rash on her arm yesterday- she has been itching since. Reason for Disposition . SEVERE itching (e.g., interferes with sleep or normal activities)  Answer Assessment - Initial Assessment Questions 1. APPEARANCE of RASH: "Describe the rash."      Red, swollen. cluster 2. LOCATION: "Where is the rash located?"      Left arm- upper and lower 3. NUMBER: "How many spots are there?"      4 in cluster, 4 spread out 4. SIZE: "How big are the spots?" (Inches, centimeters or compare to size of a coin)      One area 1 inch and 1/4 inch area 5. ONSET: "When did the rash start?"      Late yesterday evening 7 pm 6. ITCHING: "Does the rash itch?" If so, ask: "How bad is the itch?"  (Scale 1-10; or mild, moderate, severe)     Yes- severe 7. PAIN: "Does the rash hurt?" If so, ask: "How bad is the pain?"  (Scale 1-10; or mild, moderate, severe)     no 8. OTHER SYMPTOMS: "Do you have any other symptoms?" (e.g., fever)     no 9. PREGNANCY: "Is there any chance you are pregnant?" "When was your last menstrual period?"     N/a LMP- mid- May  Protocols used: POISON IVY - OAK - SUMAC-A-AH, RASH OR REDNESS - LOCALIZED-A-AH

## 2017-08-29 NOTE — Patient Instructions (Signed)
You had the steroid shot today  Please take all new medication as prescribed - the steroid cream  Please continue all other medications as before, and refills have been done if requested.  Please have the pharmacy call with any other refills you may need.  Please keep your appointments with your specialists as you may have planned

## 2017-09-08 ENCOUNTER — Encounter: Payer: Self-pay | Admitting: Podiatry

## 2017-09-08 ENCOUNTER — Ambulatory Visit (INDEPENDENT_AMBULATORY_CARE_PROVIDER_SITE_OTHER): Payer: BLUE CROSS/BLUE SHIELD | Admitting: Podiatry

## 2017-09-08 DIAGNOSIS — B351 Tinea unguium: Secondary | ICD-10-CM

## 2017-09-08 DIAGNOSIS — Z79899 Other long term (current) drug therapy: Secondary | ICD-10-CM | POA: Diagnosis not present

## 2017-09-08 LAB — CBC WITH DIFFERENTIAL/PLATELET
Basophils Absolute: 35 cells/uL (ref 0–200)
Basophils Relative: 0.3 %
Eosinophils Absolute: 69 cells/uL (ref 15–500)
Eosinophils Relative: 0.6 %
HEMATOCRIT: 39.8 % (ref 35.0–45.0)
Hemoglobin: 13.6 g/dL (ref 11.7–15.5)
LYMPHS ABS: 2956 {cells}/uL (ref 850–3900)
MCH: 29.2 pg (ref 27.0–33.0)
MCHC: 34.2 g/dL (ref 32.0–36.0)
MCV: 85.4 fL (ref 80.0–100.0)
MPV: 9.6 fL (ref 7.5–12.5)
Monocytes Relative: 8.1 %
NEUTROS PCT: 65.3 %
Neutro Abs: 7510 cells/uL (ref 1500–7800)
Platelets: 419 10*3/uL — ABNORMAL HIGH (ref 140–400)
RBC: 4.66 10*6/uL (ref 3.80–5.10)
RDW: 12.5 % (ref 11.0–15.0)
Total Lymphocyte: 25.7 %
WBC: 11.5 10*3/uL — ABNORMAL HIGH (ref 3.8–10.8)
WBCMIX: 932 {cells}/uL (ref 200–950)

## 2017-09-08 LAB — HEPATIC FUNCTION PANEL
AG Ratio: 1.7 (calc) (ref 1.0–2.5)
ALBUMIN MSPROF: 4.5 g/dL (ref 3.6–5.1)
ALT: 12 U/L (ref 6–29)
AST: 14 U/L (ref 10–30)
Alkaline phosphatase (APISO): 90 U/L (ref 33–115)
Bilirubin, Direct: 0.1 mg/dL (ref 0.0–0.2)
Globulin: 2.7 g/dL (calc) (ref 1.9–3.7)
Indirect Bilirubin: 0.5 mg/dL (calc) (ref 0.2–1.2)
TOTAL PROTEIN: 7.2 g/dL (ref 6.1–8.1)
Total Bilirubin: 0.6 mg/dL (ref 0.2–1.2)

## 2017-09-08 MED ORDER — TERBINAFINE HCL 250 MG PO TABS: 250.0000 mg | ORAL_TABLET | Freq: Every day | ORAL | 0 refills | Status: DC

## 2017-09-08 NOTE — Progress Notes (Signed)
   Subjective:    Patient ID: Adriana Hansen, female    DOB: Jul 08, 1985, 32 y.o.   MRN: [id number]  HPI 32 year old female presents the office today for concerns of toenail thickening discoloration.  Denies any pain in the nails denies any redness or drainage or any swelling.  She said no recent treatment for this.  It is been ongoing for about 6 to 7 years.  No other concerns.    Review of Systems  All other systems reviewed and are negative.  Past Medical History:  Diagnosis Date  . 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 Gonorrhea 2009    Past Surgical History:  Procedure Laterality Date  . LAPAROSCOPY  10/13/2010   Procedure: LAPAROSCOPY OPERATIVE;  Surgeon: Sherron MondayJody Bovard, MD;  Location: [facility];  Service: Gynecology;  Laterality: N/A;  Lysis of Adhesions. Right Salpingooophrectomy,Evacuation of Hematoperitoneum  . RIGHT OOPHORECTOMY  11/13/2010   tubal pregnancy  . TONSILLECTOMY    . WISDOM TOOTH EXTRACTION       Current Outpatient Medications:  .  amphetamine-dextroamphetamine (ADDERALL) 15 MG tablet, Take 1 tablet by mouth 2 (two) times daily., Disp: 60 tablet, Rfl: 0 .  terbinafine (LAMISIL) 250 MG tablet, Take 1 tablet (250 mg total) by mouth daily., Disp: 90 tablet, Rfl: 0 .  triamcinolone cream (KENALOG) 0.1 %, Apply 1 application topically 2 (two) times daily., Disp: 30 g, Rfl: 0  No Known Allergies       Objective:   Physical Exam  General: AAO x3, NAD  Dermatological: Nails appear to be hypertrophic, dystrophic with yellow-brown discoloration.  There is no pain to the nails there is no swelling redness or drainage.  Bilateral second digit toenails are the worst.  No open lesions identified.  Vascular: Dorsalis Pedis artery and Posterior Tibial artery pedal pulses are 2/4 bilateral with immedate capillary fill time. Pedal hair growth present. No varicosities and no lower extremity edema present bilateral. There is no  pain with calf compression, swelling, warmth, erythema.   Neruologic: Grossly intact via light touch bilateral. Protective threshold with Semmes Wienstein monofilament intact to all pedal sites bilateral.   Musculoskeletal: No gross boney pedal deformities bilateral. No pain, crepitus, or limitation noted with foot and ankle range of motion bilateral. Muscular strength 5/5 in all groups tested bilateral.  Gait: Unassisted, Nonantalgic.      Assessment & Plan:  32 year old female with onychomycosis -Treatment options discussed including all alternatives, risks, and complications -Etiology of symptoms were discussed -We discussed toenail removal.  Discussed that there is no guarantee that if the nails are thickened to come back and looking better and is to return to clinic if worse.  If she wants to have this done will be more than happy to do it as she did bring this up I think should benefit also either way with using oral Lamisil or other antifungals which we discussed.  After discussion she like to proceed with Lamisil for this time.  Discussed side effects, success rates.  We will check a CBC and LFT but she is not to start the medication side call her the results of blood work and she verbally understood this.  Vivi BarrackMatthew R Sylas Twombly DPM

## 2017-09-08 NOTE — Patient Instructions (Signed)

## 2017-09-12 DIAGNOSIS — B351 Tinea unguium: Secondary | ICD-10-CM | POA: Insufficient documentation

## 2017-09-20 ENCOUNTER — Telehealth: Payer: Self-pay | Admitting: Podiatry

## 2017-09-20 MED ORDER — TERBINAFINE HCL 250 MG PO TABS: 250.0000 mg | ORAL_TABLET | Freq: Every day | ORAL | 0 refills | Status: DC

## 2017-09-20 NOTE — Telephone Encounter (Signed)
I sent this to JQ the other week to call her to let her know that she could start Lamsil. Please let her know. Thanks.

## 2017-09-20 NOTE — Addendum Note (Signed)
Addended by: Alphia Kava'CONNELL, VALERY D on: 09/20/2017 05:11 PM   Modules accepted: Orders

## 2017-09-20 NOTE — Telephone Encounter (Signed)
I informed pt Adriana Hansen reviewed the blood work and she could begin the lamisil and she would need to make an appt at the 4-6 weeks timeframe. Pt asked that I reorder to [facility] to make sure it was there. I did reorder.

## 2017-09-20 NOTE — Telephone Encounter (Signed)
Pt is wanting lab results. Waiting to take medication. Please give her a call.

## 2017-09-23 ENCOUNTER — Other Ambulatory Visit: Payer: Self-pay | Admitting: Internal Medicine

## 2017-09-23 DIAGNOSIS — F988 Other specified behavioral and emotional disorders with onset usually occurring in childhood and adolescence: Secondary | ICD-10-CM

## 2017-09-23 MED ORDER — AMPHETAMINE-DEXTROAMPHETAMINE 15 MG PO TABS: 15.0000 mg | ORAL_TABLET | Freq: Two times a day (BID) | ORAL | 0 refills | Status: DC

## 2017-09-23 NOTE — Telephone Encounter (Signed)
[place] Controlled Substance Database checked. Last filled on 08/23/17 

## 2017-10-19 ENCOUNTER — Other Ambulatory Visit: Payer: Self-pay | Admitting: Internal Medicine

## 2017-10-19 DIAGNOSIS — F988 Other specified behavioral and emotional disorders with onset usually occurring in childhood and adolescence: Secondary | ICD-10-CM

## 2017-10-20 ENCOUNTER — Ambulatory Visit: Payer: BLUE CROSS/BLUE SHIELD | Admitting: Podiatry

## 2017-10-20 MED ORDER — AMPHETAMINE-DEXTROAMPHETAMINE 15 MG PO TABS: 15.0000 mg | ORAL_TABLET | Freq: Two times a day (BID) | ORAL | 0 refills | Status: DC

## 2017-10-20 NOTE — Telephone Encounter (Signed)
[place] Controlled Substance Database checked. Last filled on 09/23/17 

## 2017-11-01 ENCOUNTER — Ambulatory Visit: Payer: BLUE CROSS/BLUE SHIELD | Admitting: Urgent Care

## 2017-11-23 ENCOUNTER — Other Ambulatory Visit: Payer: Self-pay | Admitting: Internal Medicine

## 2017-11-23 DIAGNOSIS — F988 Other specified behavioral and emotional disorders with onset usually occurring in childhood and adolescence: Secondary | ICD-10-CM

## 2017-11-24 ENCOUNTER — Other Ambulatory Visit: Payer: Self-pay | Admitting: Internal Medicine

## 2017-11-24 DIAGNOSIS — F988 Other specified behavioral and emotional disorders with onset usually occurring in childhood and adolescence: Secondary | ICD-10-CM

## 2017-11-24 MED ORDER — AMPHETAMINE-DEXTROAMPHETAMINE 15 MG PO TABS: 15.0000 mg | ORAL_TABLET | Freq: Two times a day (BID) | ORAL | 0 refills | Status: DC

## 2017-11-24 NOTE — Telephone Encounter (Signed)
Last refilled 10/24/17 #60 Last OV 08/02/17 Next OV 02/02/18

## 2017-11-25 MED ORDER — AMPHETAMINE-DEXTROAMPHETAMINE 15 MG PO TABS: 15.0000 mg | ORAL_TABLET | Freq: Two times a day (BID) | ORAL | 0 refills | Status: DC

## 2017-11-25 NOTE — Telephone Encounter (Signed)
Check [place] registry last filled 10/24/2017../lmb  

## 2017-12-08 ENCOUNTER — Other Ambulatory Visit: Payer: Self-pay

## 2017-12-08 DIAGNOSIS — F988 Other specified behavioral and emotional disorders with onset usually occurring in childhood and adolescence: Secondary | ICD-10-CM

## 2017-12-08 MED ORDER — AMPHETAMINE-DEXTROAMPHETAMINE 15 MG PO TABS: 15.0000 mg | ORAL_TABLET | Freq: Two times a day (BID) | ORAL | 0 refills | Status: DC

## 2017-12-08 NOTE — Telephone Encounter (Signed)
Copied from CRM [id number]. Topic: Quick Communication - See Telephone Encounter >> Dec 08, 2017 11:29 AM Herby AbrahamJohnson, Shiquita C wrote: CRM for notification. See Telephone encounter for: 12/08/17.  Pt says that her pharmacy (on file) currently have her medication amphetamine-dextroamphetamine (ADDERALL) 15 MG tablet on back order. Pt [facility]. Please assist pt further.

## 2017-12-08 NOTE — Telephone Encounter (Signed)
Medication pended with requested pharmacy.

## 2018-01-06 DIAGNOSIS — Z113 Encounter for screening for infections with a predominantly sexual mode of transmission: Secondary | ICD-10-CM | POA: Diagnosis not present

## 2018-01-06 DIAGNOSIS — Z01419 Encounter for gynecological examination (general) (routine) without abnormal findings: Secondary | ICD-10-CM | POA: Diagnosis not present

## 2018-01-06 DIAGNOSIS — Z6832 Body mass index (BMI) 32.0-32.9, adult: Secondary | ICD-10-CM | POA: Diagnosis not present

## 2018-01-06 DIAGNOSIS — Z1389 Encounter for screening for other disorder: Secondary | ICD-10-CM | POA: Diagnosis not present

## 2018-01-06 DIAGNOSIS — Z130 Encounter for screening for diseases of the blood and blood-forming organs and certain disorders involving the immune mechanism: Secondary | ICD-10-CM | POA: Diagnosis not present

## 2018-01-06 DIAGNOSIS — Z124 Encounter for screening for malignant neoplasm of cervix: Secondary | ICD-10-CM | POA: Diagnosis not present

## 2018-01-08 ENCOUNTER — Other Ambulatory Visit: Payer: Self-pay | Admitting: Internal Medicine

## 2018-01-08 DIAGNOSIS — F988 Other specified behavioral and emotional disorders with onset usually occurring in childhood and adolescence: Secondary | ICD-10-CM

## 2018-01-09 MED ORDER — AMPHETAMINE-DEXTROAMPHETAMINE 15 MG PO TABS: 15.0000 mg | ORAL_TABLET | Freq: Two times a day (BID) | ORAL | 0 refills | Status: DC

## 2018-01-09 NOTE — Telephone Encounter (Signed)
[place] controlled substance database checked.  Ok to fill medication.  

## 2018-01-12 DIAGNOSIS — N926 Irregular menstruation, unspecified: Secondary | ICD-10-CM | POA: Diagnosis not present

## 2018-01-12 DIAGNOSIS — Z113 Encounter for screening for infections with a predominantly sexual mode of transmission: Secondary | ICD-10-CM | POA: Diagnosis not present

## 2018-01-26 DIAGNOSIS — N926 Irregular menstruation, unspecified: Secondary | ICD-10-CM | POA: Diagnosis not present

## 2018-02-01 NOTE — Patient Instructions (Addendum)
Flu immunization administered today.    Medications reviewed and updated.  Changes include :   none  Your prescription(s) have been submitted to your pharmacy. Please take as directed and contact our office if you believe you are having problem(s) with the medication(s).    Please followup in 6 months   

## 2018-02-01 NOTE — Progress Notes (Signed)
Subjective:    Patient ID: CARAN STORCK, female    DOB: 1985-06-02, 32 y.o.   MRN: [id number]  HPI The patient is here for follow up.  ADD:  She is taking her medication as prescribed.  She feels the medication is effective.  She denies side effects, including palpitations, headaches, lightheadedness, decreased appetite and weight loss.    Medications and allergies reviewed with patient and updated if appropriate.  Patient Active Problem List   Diagnosis Date Noted  . Onychomycosis 09/12/2017  . Contact dermatitis 08/29/2017  . Tobacco abuse 08/02/2017  . Toenail deformity 08/02/2017  . Obesity (BMI 30.0-34.9) 02/02/2017  . Facial rash 03/17/2016  . Numbness of fingers of both hands 02/07/2015  . Attention deficit disorder 09/01/2006    Current Outpatient Medications on File Prior to Visit  Medication Sig Dispense Refill  . amphetamine-dextroamphetamine (ADDERALL) 15 MG tablet Take 1 tablet by mouth 2 (two) times daily. 60 tablet 0  . triamcinolone cream (KENALOG) 0.1 % Apply 1 application topically 2 (two) times daily. 30 g 0   No current facility-administered medications on file prior to visit.     Past Medical History:  Diagnosis Date  . 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 Gonorrhea 2009    Past Surgical History:  Procedure Laterality Date  . LAPAROSCOPY  10/13/2010   Procedure: LAPAROSCOPY OPERATIVE;  Surgeon: Sherron Monday, MD;  Location: [facility];  Service: Gynecology;  Laterality: N/A;  Lysis of Adhesions. Right Salpingooophrectomy,Evacuation of Hematoperitoneum  . RIGHT OOPHORECTOMY  11/13/2010   tubal pregnancy  . TONSILLECTOMY    . WISDOM TOOTH EXTRACTION      Social History   Socioeconomic History  . Marital status: Single    Spouse name: Not on file  . Number of children: Not on file  . Years of education: Not on file  . Highest education level: Not on file  Occupational History  . Not on file    Social Needs  . Financial resource strain: Not on file  . Food insecurity:    Worry: Not on file    Inability: Not on file  . Transportation needs:    Medical: Not on file    Non-medical: Not on file  Tobacco Use  . Smoking status: Current Every Day Smoker    Packs/day: 0.50  . Smokeless tobacco: Never Used  . Tobacco comment: smoked 1999- present , up to 1 ppd.. 09/10/13 : 1/2 ppd  Substance and Sexual Activity  . Alcohol use: Yes    Comment: rare  . Drug use: No  . Sexual activity: Yes  Lifestyle  . Physical activity:    Days per week: Not on file    Minutes per session: Not on file  . Stress: Not on file  Relationships  . Social connections:    Talks on phone: Not on file    Gets together: Not on file    Attends religious service: Not on file    Active member of club or organization: Not on file    Attends meetings of clubs or organizations: Not on file    Relationship status: Not on file  Other Topics Concern  . Not on file  Social History Narrative   Acute, no regular exercise    Family History  Problem Relation Age of Onset  . Hyperlipidemia Father   . Hypertension Father   . Hyperlipidemia Brother   . Hyperlipidemia Maternal Grandfather   .  Hypertension Maternal Grandfather   . Parkinson's disease Maternal Grandfather   . Hyperlipidemia Mother   . Hyperlipidemia Maternal Grandmother   . Cancer Neg Hx   . Stroke Neg Hx   . Diabetes Neg Hx   . Heart disease Neg Hx     Review of Systems  Constitutional: Negative for appetite change and unexpected weight change (has been trying to lose weight).  Cardiovascular: Negative for chest pain and palpitations.  Neurological: Negative for dizziness, light-headedness and headaches.       Objective:   Vitals:   02/02/18 0757  BP: 120/64  Pulse: 92  Resp: 16  Temp: 97.9 F (36.6 C)  SpO2: 99%   BP Readings from Last 3 Encounters:  02/02/18 120/64  08/29/17 134/84  08/02/17 134/80   Wt Readings from  Last 3 Encounters:  02/02/18 197 lb (89.4 kg)  08/29/17 207 lb (93.9 kg)  08/02/17 205 lb (93 kg)   Body mass index is 31.8 kg/m.   Physical Exam    Constitutional: Appears well-developed and well-nourished. No distress.  HENT:  Head: Normocephalic and atraumatic.  Neck: Neck supple. No tracheal deviation present. No thyromegaly present.  No cervical lymphadenopathy Cardiovascular: Normal rate, regular rhythm and normal heart sounds.   No murmur heard.  No edema Pulmonary/Chest: Effort normal and breath sounds normal. No respiratory distress. No has no wheezes. No rales.  Skin: Skin is warm and dry. Not diaphoretic.  Psychiatric: Normal mood and affect. Behavior is normal.      Assessment & Plan:    See Problem List for Assessment and Plan of chronic medical problems.

## 2018-02-02 ENCOUNTER — Encounter: Payer: Self-pay | Admitting: Internal Medicine

## 2018-02-02 ENCOUNTER — Ambulatory Visit (INDEPENDENT_AMBULATORY_CARE_PROVIDER_SITE_OTHER): Payer: BLUE CROSS/BLUE SHIELD | Admitting: Internal Medicine

## 2018-02-02 VITALS — BP 120/64 | HR 92 | Temp 97.9°F | Resp 16 | Ht 66.0 in | Wt 197.0 lb

## 2018-02-02 DIAGNOSIS — F988 Other specified behavioral and emotional disorders with onset usually occurring in childhood and adolescence: Secondary | ICD-10-CM | POA: Diagnosis not present

## 2018-02-02 DIAGNOSIS — Z23 Encounter for immunization: Secondary | ICD-10-CM

## 2018-02-02 DIAGNOSIS — N926 Irregular menstruation, unspecified: Secondary | ICD-10-CM | POA: Diagnosis not present

## 2018-02-02 MED ORDER — AMPHETAMINE-DEXTROAMPHETAMINE 15 MG PO TABS: 15.0000 mg | ORAL_TABLET | Freq: Two times a day (BID) | ORAL | 0 refills | Status: DC

## 2018-02-02 NOTE — Addendum Note (Signed)
Addended by: Mercer Pod E on: 02/02/2018 09:06 AM   Modules accepted: Orders

## 2018-02-02 NOTE — Assessment & Plan Note (Signed)
[place] controlled substance database checked.  Ok to fill medication - due for refill in 5 days.  Sent to pharmacy. Controlled, stable Taking medication appropriately Continue current dose of medication Follow up in 6 months

## 2018-02-06 ENCOUNTER — Other Ambulatory Visit: Payer: Self-pay | Admitting: Internal Medicine

## 2018-02-06 DIAGNOSIS — F988 Other specified behavioral and emotional disorders with onset usually occurring in childhood and adolescence: Secondary | ICD-10-CM

## 2018-02-07 NOTE — Telephone Encounter (Signed)
Last refill was 01/09/18 Last OV was 02/02/18 Next OV is 08/10/18

## 2018-02-09 MED ORDER — AMPHETAMINE-DEXTROAMPHETAMINE 15 MG PO TABS: 15.0000 mg | ORAL_TABLET | Freq: Two times a day (BID) | ORAL | 0 refills | Status: DC

## 2018-04-04 ENCOUNTER — Other Ambulatory Visit: Payer: Self-pay | Admitting: Internal Medicine

## 2018-04-04 DIAGNOSIS — F988 Other specified behavioral and emotional disorders with onset usually occurring in childhood and adolescence: Secondary | ICD-10-CM

## 2018-04-05 MED ORDER — AMPHETAMINE-DEXTROAMPHETAMINE 15 MG PO TABS: 15.0000 mg | ORAL_TABLET | Freq: Two times a day (BID) | ORAL | 0 refills | Status: DC

## 2018-04-05 NOTE — Telephone Encounter (Signed)
[place] Controlled Substance Database checked. Last filled on 03/08/18  Last OV 02/02/18

## 2018-04-07 DIAGNOSIS — N9489 Other specified conditions associated with female genital organs and menstrual cycle: Secondary | ICD-10-CM | POA: Diagnosis not present

## 2018-04-07 DIAGNOSIS — Z8759 Personal history of other complications of pregnancy, childbirth and the puerperium: Secondary | ICD-10-CM | POA: Diagnosis not present

## 2018-06-05 ENCOUNTER — Other Ambulatory Visit: Payer: Self-pay | Admitting: Internal Medicine

## 2018-06-05 DIAGNOSIS — F988 Other specified behavioral and emotional disorders with onset usually occurring in childhood and adolescence: Secondary | ICD-10-CM

## 2018-06-05 MED ORDER — AMPHETAMINE-DEXTROAMPHETAMINE 15 MG PO TABS: 15.0000 mg | ORAL_TABLET | Freq: Two times a day (BID) | ORAL | 0 refills | Status: DC

## 2018-06-05 NOTE — Telephone Encounter (Signed)
Check [place] registry last filled 05/07/2018../lmb  

## 2018-06-14 ENCOUNTER — Other Ambulatory Visit: Payer: Self-pay

## 2018-06-14 ENCOUNTER — Encounter (HOSPITAL_BASED_OUTPATIENT_CLINIC_OR_DEPARTMENT_OTHER): Payer: Self-pay | Admitting: *Deleted

## 2018-06-14 NOTE — Progress Notes (Signed)
Spoke with patient via telephone for pre op interview. NPO after MN, no medications AM of surgery. Will need CBC and UPT AM of surgery. Arrival time 0700.

## 2018-06-15 ENCOUNTER — Other Ambulatory Visit: Payer: Self-pay | Admitting: Internal Medicine

## 2018-06-15 DIAGNOSIS — F988 Other specified behavioral and emotional disorders with onset usually occurring in childhood and adolescence: Secondary | ICD-10-CM

## 2018-06-15 DIAGNOSIS — Z3184 Encounter for fertility preservation procedure: Secondary | ICD-10-CM | POA: Diagnosis not present

## 2018-06-15 DIAGNOSIS — Q5210 Doubling of vagina, unspecified: Secondary | ICD-10-CM | POA: Diagnosis not present

## 2018-06-16 NOTE — Telephone Encounter (Signed)
Medication last filled on 05/30/2018 per database.   Refusing refill.

## 2018-06-23 ENCOUNTER — Ambulatory Visit (HOSPITAL_BASED_OUTPATIENT_CLINIC_OR_DEPARTMENT_OTHER): Admit: 2018-06-23 | Payer: BLUE CROSS/BLUE SHIELD | Admitting: Obstetrics and Gynecology

## 2018-06-23 HISTORY — DX: Unspecified tubal pregnancy without intrauterine pregnancy: O00.109

## 2018-06-23 SURGERY — RESECTION, SEPTUM, UTERUS
Anesthesia: General

## 2018-07-03 ENCOUNTER — Other Ambulatory Visit: Payer: Self-pay | Admitting: Internal Medicine

## 2018-07-03 DIAGNOSIS — F988 Other specified behavioral and emotional disorders with onset usually occurring in childhood and adolescence: Secondary | ICD-10-CM

## 2018-07-04 ENCOUNTER — Other Ambulatory Visit: Payer: Self-pay | Admitting: Internal Medicine

## 2018-07-04 DIAGNOSIS — F988 Other specified behavioral and emotional disorders with onset usually occurring in childhood and adolescence: Secondary | ICD-10-CM

## 2018-07-05 MED ORDER — AMPHETAMINE-DEXTROAMPHETAMINE 15 MG PO TABS: 15.0000 mg | ORAL_TABLET | Freq: Two times a day (BID) | ORAL | 0 refills | Status: DC

## 2018-07-05 NOTE — Telephone Encounter (Signed)
Per Database, last refill of Adderall was 06/05/2018  Last OV with PCP was 02/02/2018. Upcoming appt 08/16/2018

## 2018-07-05 NOTE — Telephone Encounter (Signed)
[place] Controlled Database Checked Last filled: 06/05/18 # 60 LOV w/you: 02/02/18 Next appt w/you: 08/17/18

## 2018-08-03 ENCOUNTER — Other Ambulatory Visit: Payer: Self-pay | Admitting: Internal Medicine

## 2018-08-03 DIAGNOSIS — S52502A Unspecified fracture of the lower end of left radius, initial encounter for closed fracture: Secondary | ICD-10-CM | POA: Diagnosis not present

## 2018-08-03 DIAGNOSIS — F988 Other specified behavioral and emotional disorders with onset usually occurring in childhood and adolescence: Secondary | ICD-10-CM

## 2018-08-03 MED ORDER — AMPHETAMINE-DEXTROAMPHETAMINE 15 MG PO TABS: 15.0000 mg | ORAL_TABLET | Freq: Two times a day (BID) | ORAL | 0 refills | Status: DC

## 2018-08-03 NOTE — Telephone Encounter (Signed)
Last OV  02/02/2018 Next OV   08/17/2018   Controlled Substance Database checked. Last filled on 07/05/18

## 2018-08-10 ENCOUNTER — Encounter: Payer: BLUE CROSS/BLUE SHIELD | Admitting: Internal Medicine

## 2018-08-14 DIAGNOSIS — S52502D Unspecified fracture of the lower end of left radius, subsequent encounter for closed fracture with routine healing: Secondary | ICD-10-CM | POA: Diagnosis not present

## 2018-08-16 NOTE — Patient Instructions (Addendum)
All other Health Maintenance issues reviewed.   All recommended immunizations and age-appropriate screenings are up-to-date or discussed.  No immunizations administered today.   Medications reviewed and updated.  Changes include :  none   Your prescription(s) have been submitted to your pharmacy. Please take as directed and contact our office if you believe you are having problem(s) with the medication(s).   Please followup in 6 months   Health Maintenance, Female Adopting a healthy lifestyle and getting preventive care can go a long way to promote health and wellness. Talk with your health care provider about what schedule of regular examinations is right for you. This is a good chance for you to check in with your provider about disease prevention and staying healthy. In between checkups, there are plenty of things you can do on your own. Experts have done a lot of research about which lifestyle changes and preventive measures are most likely to keep you healthy. Ask your health care provider for more information. Weight and diet Eat a healthy diet  Be sure to include plenty of vegetables, fruits, low-fat dairy products, and lean protein.  Do not eat a lot of foods high in solid fats, added sugars, or salt.  Get regular exercise. This is one of the most important things you can do for your health. ? Most adults should exercise for at least 150 minutes each week. The exercise should increase your heart rate and make you sweat (moderate-intensity exercise). ? Most adults should also do strengthening exercises at least twice a week. This is in addition to the moderate-intensity exercise. Maintain a healthy weight  Body mass index (BMI) is a measurement that can be used to identify possible weight problems. It estimates body fat based on height and weight. Your health care provider can help determine your BMI and help you achieve or maintain a healthy weight.  For females 47 years of age  and older: ? A BMI below 18.5 is considered underweight. ? A BMI of 18.5 to 24.9 is normal. ? A BMI of 25 to 29.9 is considered overweight. ? A BMI of 30 and above is considered obese. Watch levels of cholesterol and blood lipids  You should start having your blood tested for lipids and cholesterol at 35 years of age, then have this test every 5 years.  You may need to have your cholesterol levels checked more often if: ? Your lipid or cholesterol levels are high. ? You are older than 35 years of age. ? You are at high risk for heart disease. Cancer screening Lung Cancer  Lung cancer screening is recommended for adults 73-71 years old who are at high risk for lung cancer because of a history of smoking.  A yearly low-dose CT scan of the lungs is recommended for people who: ? Currently smoke. ? Have quit within the past 15 years. ? Have at least a 30-pack-year history of smoking. A pack year is smoking an average of one pack of cigarettes a day for 1 year.  Yearly screening should continue until it has been 15 years since you quit.  Yearly screening should stop if you develop a health problem that would prevent you from having lung cancer treatment. Breast Cancer  Practice breast self-awareness. This means understanding how your breasts normally appear and feel.  It also means doing regular breast self-exams. Let your health care provider know about any changes, no matter how small.  If you are in your 20s or 30s, you should  have a clinical breast exam (CBE) by a health care provider every 1-3 years as part of a regular health exam.  If you are 30 or older, have a CBE every year. Also consider having a breast X-ray (mammogram) every year.  If you have a family history of breast cancer, talk to your health care provider about genetic screening.  If you are at high risk for breast cancer, talk to your health care provider about having an MRI and a mammogram every year.  Breast  cancer gene (BRCA) assessment is recommended for women who have family members with BRCA-related cancers. BRCA-related cancers include: ? Breast. ? Ovarian. ? Tubal. ? Peritoneal cancers.  Results of the assessment will determine the need for genetic counseling and BRCA1 and BRCA2 testing. Cervical Cancer Your health care provider may recommend that you be screened regularly for cancer of the pelvic organs (ovaries, uterus, and vagina). This screening involves a pelvic examination, including checking for microscopic changes to the surface of your cervix (Pap test). You may be encouraged to have this screening done every 3 years, beginning at age 64.  For women ages 6-65, health care providers may recommend pelvic exams and Pap testing every 3 years, or they may recommend the Pap and pelvic exam, combined with testing for human papilloma virus (HPV), every 5 years. Some types of HPV increase your risk of cervical cancer. Testing for HPV may also be done on women of any age with unclear Pap test results.  Other health care providers may not recommend any screening for nonpregnant women who are considered low risk for pelvic cancer and who do not have symptoms. Ask your health care provider if a screening pelvic exam is right for you.  If you have had past treatment for cervical cancer or a condition that could lead to cancer, you need Pap tests and screening for cancer for at least 20 years after your treatment. If Pap tests have been discontinued, your risk factors (such as having a new sexual partner) need to be reassessed to determine if screening should resume. Some women have medical problems that increase the chance of getting cervical cancer. In these cases, your health care provider may recommend more frequent screening and Pap tests. Colorectal Cancer  This type of cancer can be detected and often prevented.  Routine colorectal cancer screening usually begins at 35 years of age and  continues through 35 years of age.  Your health care provider may recommend screening at an earlier age if you have risk factors for colon cancer.  Your health care provider may also recommend using home test kits to check for hidden blood in the stool.  A small camera at the end of a tube can be used to examine your colon directly (sigmoidoscopy or colonoscopy). This is done to check for the earliest forms of colorectal cancer.  Routine screening usually begins at age 70.  Direct examination of the colon should be repeated every 5-10 years through 35 years of age. However, you may need to be screened more often if early forms of precancerous polyps or small growths are found. Skin Cancer  Check your skin from head to toe regularly.  Tell your health care provider about any new moles or changes in moles, especially if there is a change in a mole's shape or color.  Also tell your health care provider if you have a mole that is larger than the size of a pencil eraser.  Always use sunscreen. Apply  sunscreen liberally and repeatedly throughout the day.  Protect yourself by wearing long sleeves, pants, a wide-brimmed hat, and sunglasses whenever you are outside. Heart disease, diabetes, and high blood pressure  High blood pressure causes heart disease and increases the risk of stroke. High blood pressure is more likely to develop in: ? People who have blood pressure in the high end of the normal range (130-139/85-89 mm Hg). ? People who are overweight or obese. ? People who are African American.  If you are 59-33 years of age, have your blood pressure checked every 3-5 years. If you are 35 years of age or older, have your blood pressure checked every year. You should have your blood pressure measured twice-once when you are at a hospital or clinic, and once when you are not at a hospital or clinic. Record the average of the two measurements. To check your blood pressure when you are not at a  hospital or clinic, you can use: ? An automated blood pressure machine at a pharmacy. ? A home blood pressure monitor.  If you are between 68 years and 48 years old, ask your health care provider if you should take aspirin to prevent strokes.  Have regular diabetes screenings. This involves taking a blood sample to check your fasting blood sugar level. ? If you are at a normal weight and have a low risk for diabetes, have this test once every three years after 35 years of age. ? If you are overweight and have a high risk for diabetes, consider being tested at a younger age or more often. Preventing infection Hepatitis B  If you have a higher risk for hepatitis B, you should be screened for this virus. You are considered at high risk for hepatitis B if: ? You were born in a country where hepatitis B is common. Ask your health care provider which countries are considered high risk. ? Your parents were born in a high-risk country, and you have not been immunized against hepatitis B (hepatitis B vaccine). ? You have HIV or AIDS. ? You use needles to inject street drugs. ? You live with someone who has hepatitis B. ? You have had sex with someone who has hepatitis B. ? You get hemodialysis treatment. ? You take certain medicines for conditions, including cancer, organ transplantation, and autoimmune conditions. Hepatitis C  Blood testing is recommended for: ? Everyone born from [birth year] through 1967. ? Anyone with known risk factors for hepatitis C. Sexually transmitted infections (STIs)  You should be screened for sexually transmitted infections (STIs) including gonorrhea and chlamydia if: ? You are sexually active and are younger than 35 years of age. ? You are older than 35 years of age and your health care provider tells you that you are at risk for this type of infection. ? Your sexual activity has changed since you were last screened and you are at an increased risk for chlamydia or  gonorrhea. Ask your health care provider if you are at risk.  If you do not have HIV, but are at risk, it may be recommended that you take a prescription medicine daily to prevent HIV infection. This is called pre-exposure prophylaxis (PrEP). You are considered at risk if: ? You are sexually active and do not regularly use condoms or know the HIV status of your partner(s). ? You take drugs by injection. ? You are sexually active with a partner who has HIV. Talk with your health care provider about whether you are  at high risk of being infected with HIV. If you choose to begin PrEP, you should first be tested for HIV. You should then be tested every 3 months for as long as you are taking PrEP. Pregnancy  If you are premenopausal and you may become pregnant, ask your health care provider about preconception counseling.  If you may become pregnant, take 400 to 800 micrograms (mcg) of folic acid every day.  If you want to prevent pregnancy, talk to your health care provider about birth control (contraception). Osteoporosis and menopause  Osteoporosis is a disease in which the bones lose minerals and strength with aging. This can result in serious bone fractures. Your risk for osteoporosis can be identified using a bone density scan.  If you are 32 years of age or older, or if you are at risk for osteoporosis and fractures, ask your health care provider if you should be screened.  Ask your health care provider whether you should take a calcium or vitamin D supplement to lower your risk for osteoporosis.  Menopause may have certain physical symptoms and risks.  Hormone replacement therapy may reduce some of these symptoms and risks. Talk to your health care provider about whether hormone replacement therapy is right for you. Follow these instructions at home:  Schedule regular health, dental, and eye exams.  Stay current with your immunizations.  Do not use any tobacco products including  cigarettes, chewing tobacco, or electronic cigarettes.  If you are pregnant, do not drink alcohol.  If you are breastfeeding, limit how much and how often you drink alcohol.  Limit alcohol intake to no more than 1 drink per day for nonpregnant women. One drink equals 12 ounces of beer, 5 ounces of wine, or 1 ounces of hard liquor.  Do not use street drugs.  Do not share needles.  Ask your health care provider for help if you need support or information about quitting drugs.  Tell your health care provider if you often feel depressed.  Tell your health care provider if you have ever been abused or do not feel safe at home. This information is not intended to replace advice given to you by your health care provider. Make sure you discuss any questions you have with your health care provider. Document Released: 09/28/2010 Document Revised: 08/21/2015 Document Reviewed: 12/17/2014 Elsevier Interactive Patient Education  2019 [employer].

## 2018-08-16 NOTE — Progress Notes (Signed)
Subjective:    Patient ID: Adriana Hansen, female    DOB: 1985-06-02, 33 y.o.   MRN: [id number]  HPI She is here for a physical exam.   She broke her right radius since she was here last.  She is in a cast and did not need surgery.    She will be having GYN surgery in July - removal of a uterine septum.   Medications and allergies reviewed with patient and updated if appropriate.  Patient Active Problem List   Diagnosis Date Noted  . Onychomycosis 09/12/2017  . Contact dermatitis 08/29/2017  . Tobacco abuse 08/02/2017  . Toenail deformity 08/02/2017  . Obesity (BMI 30.0-34.9) 02/02/2017  . Facial rash 03/17/2016  . Numbness of fingers of both hands 02/07/2015  . Attention deficit disorder 09/01/2006    Current Outpatient Medications on File Prior to Visit  Medication Sig Dispense Refill  . amphetamine-dextroamphetamine (ADDERALL) 15 MG tablet Take 1 tablet by mouth 2 (two) times daily. 60 tablet 0   No current facility-administered medications on file prior to visit.     Past Medical History:  Diagnosis Date  . 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 Gonorrhea 2009  . Tubal pregnancy     Past Surgical History:  Procedure Laterality Date  . LAPAROSCOPY  10/13/2010   Procedure: LAPAROSCOPY OPERATIVE;  Surgeon: Sherron Monday, MD;  Location: [facility];  Service: Gynecology;  Laterality: N/A;  Lysis of Adhesions. Right Salpingooophrectomy,Evacuation of Hematoperitoneum  . RIGHT OOPHORECTOMY  11/13/2010   tubal pregnancy  . TONSILLECTOMY    . WISDOM TOOTH EXTRACTION      Social History   Socioeconomic History  . Marital status: Single    Spouse name: Not on file  . Number of children: Not on file  . Years of education: Not on file  . Highest education level: Not on file  Occupational History  . Not on file  Social Needs  . Financial resource strain: Not on file  . Food insecurity:    Worry: Not on file    Inability:  Not on file  . Transportation needs:    Medical: Not on file    Non-medical: Not on file  Tobacco Use  . Smoking status: Current Every Day Smoker    Packs/day: 0.50  . Smokeless tobacco: Never Used  . Tobacco comment: smoked 1999- present , up to 1 ppd.. 09/10/13 : 1/2 ppd  Substance and Sexual Activity  . Alcohol use: Yes    Comment: rare  . Drug use: No  . Sexual activity: Yes  Lifestyle  . Physical activity:    Days per week: Not on file    Minutes per session: Not on file  . Stress: Not on file  Relationships  . Social connections:    Talks on phone: Not on file    Gets together: Not on file    Attends religious service: Not on file    Active member of club or organization: Not on file    Attends meetings of clubs or organizations: Not on file    Relationship status: Not on file  Other Topics Concern  . Not on file  Social History Narrative   Acute, no regular exercise    Family History  Problem Relation Age of Onset  . Hyperlipidemia Father   . Hypertension Father   . Hyperlipidemia Brother   . Hyperlipidemia Maternal Grandfather   . Hypertension Maternal Grandfather   .  Parkinson's disease Maternal Grandfather   . Hyperlipidemia Mother   . Hyperlipidemia Maternal Grandmother   . Cancer Neg Hx   . Stroke Neg Hx   . Diabetes Neg Hx   . Heart disease Neg Hx     Review of Systems  Constitutional: Negative for chills and fever.  Eyes: Negative for visual disturbance.  Respiratory: Negative for cough, shortness of breath and wheezing.   Cardiovascular: Negative for chest pain, palpitations and leg swelling.  Gastrointestinal: Negative for abdominal pain, blood in stool, constipation, diarrhea and nausea.       No gerd  Genitourinary: Negative for dysuria and hematuria.  Musculoskeletal: Negative for arthralgias and back pain.  Skin: Negative for color change and rash.  Neurological: Negative for dizziness, light-headedness and headaches.   Psychiatric/Behavioral: Negative for dysphoric mood. The patient is not nervous/anxious.        Objective:   Vitals:   08/17/18 0853  BP: 134/72  Pulse: (!) 101  Temp: 98.3 F (36.8 C)  SpO2: 98%   Filed Weights   08/17/18 0853  Weight: 205 lb (93 kg)   Body mass index is 34.11 kg/m.  BP Readings from Last 3 Encounters:  08/17/18 134/72  02/02/18 120/64  08/29/17 134/84    Wt Readings from Last 3 Encounters:  08/17/18 205 lb (93 kg)  02/02/18 197 lb (89.4 kg)  08/29/17 207 lb (93.9 kg)     Physical Exam Constitutional: She appears well-developed and well-nourished. No distress.  HENT:  Head: Normocephalic and atraumatic.  Right Ear: External ear normal. Normal ear canal and TM Left Ear: External ear normal.  Normal ear canal and TM Mouth/Throat: Oropharynx is clear and moist.  Eyes: Conjunctivae and EOM are normal.  Neck: Neck supple. No tracheal deviation present. No thyromegaly present.  No carotid bruit  Cardiovascular: Normal rate, regular rhythm and normal heart sounds.   No murmur heard.  No edema. Pulmonary/Chest: Effort normal and breath sounds normal. No respiratory distress. She has no wheezes. She has no rales.  Breast: deferred   Abdominal: Soft. She exhibits no distension. There is no tenderness.  Lymphadenopathy: She has no cervical adenopathy.  Skin: Skin is warm and dry. She is not diaphoretic.  Psychiatric: She has a normal mood and affect. Her behavior is normal.        Assessment & Plan:   Physical exam: Screening blood work deferred Immunizations   Up to date  Gyn   Up to date  Exercise  Not daily - yard work - encouraged regular exercise [employer]  Advised weight loss Skin  No concerns Substance abuse  Smoking - advised quitting  See Problem List for Assessment and Plan of chronic medical problems.   FU in 6 months - ADD

## 2018-08-17 ENCOUNTER — Encounter: Payer: Self-pay | Admitting: Internal Medicine

## 2018-08-17 ENCOUNTER — Other Ambulatory Visit: Payer: Self-pay

## 2018-08-17 ENCOUNTER — Ambulatory Visit (INDEPENDENT_AMBULATORY_CARE_PROVIDER_SITE_OTHER): Payer: BLUE CROSS/BLUE SHIELD | Admitting: Internal Medicine

## 2018-08-17 VITALS — BP 134/72 | HR 101 | Temp 98.3°F | Ht 65.0 in | Wt 205.0 lb

## 2018-08-17 DIAGNOSIS — F988 Other specified behavioral and emotional disorders with onset usually occurring in childhood and adolescence: Secondary | ICD-10-CM

## 2018-08-17 DIAGNOSIS — Z720 Tobacco use: Secondary | ICD-10-CM | POA: Diagnosis not present

## 2018-08-17 DIAGNOSIS — Z0000 Encounter for general adult medical examination without abnormal findings: Secondary | ICD-10-CM

## 2018-08-17 DIAGNOSIS — E669 Obesity, unspecified: Secondary | ICD-10-CM | POA: Diagnosis not present

## 2018-08-17 NOTE — Assessment & Plan Note (Signed)
Advised weight loss Not exercising regularly - stressed importance of regular exercise Health diet, decreased portions

## 2018-08-17 NOTE — Assessment & Plan Note (Signed)
Controlled, stable Continue current dose of medication - Adderall 15 mg BID

## 2018-08-17 NOTE — Assessment & Plan Note (Signed)
She currently smokes 1/2 PPD Smoking cessation was discussed for more than 3 minutes.  The patient was counseled on the dangers of tobacco use, and was advised to quit and reluctant to quit.  Reviewed ways of quitting smoking including nicotine replacement, vapping/e-cigarettes, cold [place], weaning off cigarettes, and pharmacotherapy (wellbutrin and chantix).  She does want to quit, but is not 100% committed to quitting.

## 2018-08-24 DIAGNOSIS — S52501D Unspecified fracture of the lower end of right radius, subsequent encounter for closed fracture with routine healing: Secondary | ICD-10-CM | POA: Diagnosis not present

## 2018-09-02 ENCOUNTER — Other Ambulatory Visit: Payer: Self-pay | Admitting: Internal Medicine

## 2018-09-02 DIAGNOSIS — F988 Other specified behavioral and emotional disorders with onset usually occurring in childhood and adolescence: Secondary | ICD-10-CM

## 2018-09-04 MED ORDER — AMPHETAMINE-DEXTROAMPHETAMINE 15 MG PO TABS: 15.0000 mg | ORAL_TABLET | Freq: Two times a day (BID) | ORAL | 0 refills | Status: DC

## 2018-09-04 NOTE — Telephone Encounter (Signed)
Last OV 08/17/18 Next OV 02/19/19 Last RF 08/03/18

## 2018-09-21 DIAGNOSIS — Z01818 Encounter for other preprocedural examination: Secondary | ICD-10-CM | POA: Diagnosis not present

## 2018-09-25 ENCOUNTER — Encounter (HOSPITAL_COMMUNITY)
Admission: RE | Admit: 2018-09-25 | Discharge: 2018-09-25 | Disposition: A | Discharge status: Home / Self Care | Payer: BC Managed Care – PPO | Source: Ambulatory Visit | Attending: Obstetrics and Gynecology | Admitting: Obstetrics and Gynecology

## 2018-09-25 ENCOUNTER — Encounter (HOSPITAL_BASED_OUTPATIENT_CLINIC_OR_DEPARTMENT_OTHER): Payer: Self-pay | Admitting: *Deleted

## 2018-09-25 ENCOUNTER — Other Ambulatory Visit: Payer: Self-pay

## 2018-09-25 ENCOUNTER — Other Ambulatory Visit (HOSPITAL_COMMUNITY)
Admission: RE | Admit: 2018-09-25 | Discharge: 2018-09-25 | Disposition: A | Discharge status: Home / Self Care | Payer: BC Managed Care – PPO | Source: Ambulatory Visit | Attending: Obstetrics and Gynecology | Admitting: Obstetrics and Gynecology

## 2018-09-25 DIAGNOSIS — Z1159 Encounter for screening for other viral diseases: Secondary | ICD-10-CM | POA: Insufficient documentation

## 2018-09-25 DIAGNOSIS — Z01812 Encounter for preprocedural laboratory examination: Secondary | ICD-10-CM | POA: Insufficient documentation

## 2018-09-25 LAB — CBC
HCT: 41.4 % (ref 36.0–46.0)
Hemoglobin: 13.2 g/dL (ref 12.0–15.0)
MCH: 28.9 pg (ref 26.0–34.0)
MCHC: 31.9 g/dL (ref 30.0–36.0)
MCV: 90.8 fL (ref 80.0–100.0)
Platelets: 429 10*3/uL — ABNORMAL HIGH (ref 150–400)
RBC: 4.56 MIL/uL (ref 3.87–5.11)
RDW: 12.8 % (ref 11.5–15.5)
WBC: 11.8 10*3/uL — ABNORMAL HIGH (ref 4.0–10.5)
nRBC: 0 % (ref 0.0–0.2)

## 2018-09-25 LAB — COMPREHENSIVE METABOLIC PANEL
ALT: 14 U/L (ref 0–44)
AST: 18 U/L (ref 15–41)
Albumin: 4.4 g/dL (ref 3.5–5.0)
Alkaline Phosphatase: 82 U/L (ref 38–126)
Anion gap: 6 (ref 5–15)
BUN: 11 mg/dL (ref 6–20)
CO2: 26 mmol/L (ref 22–32)
Calcium: 9 mg/dL (ref 8.9–10.3)
Chloride: 108 mmol/L (ref 98–111)
Creatinine, Ser: 0.69 mg/dL (ref 0.44–1.00)
GFR calc Af Amer: >60 mL/min (ref >60)
GFR calc non Af Amer: >60 mL/min (ref >60)
Glucose, Bld: 86 mg/dL (ref 70–99)
Potassium: 4.4 mmol/L (ref 3.5–5.1)
Sodium: 140 mmol/L (ref 135–145)
Total Bilirubin: 0.8 mg/dL (ref 0.3–1.2)
Total Protein: 7.6 g/dL (ref 6.5–8.1)

## 2018-09-25 LAB — SARS CORONAVIRUS 2 (TAT 6-24 HRS): SARS Coronavirus 2: NEGATIVE

## 2018-09-25 NOTE — Progress Notes (Signed)
Spoke w/ pt via phone for pre-op interview.  Npo after mn.  Arrive at 0530.  Lab work (cbc, cmp) and covid done 09-25-2018 in chart and epic.  Needs urine preg.

## 2018-09-25 NOTE — Progress Notes (Signed)

## 2018-09-27 NOTE — H&P (Signed)
Adriana Hansen is an 33 y.o. 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  Pt has had irregular vaginal bleeding.  Has had RSO for ruptured ectopic pregnancy.  D/W pt r/b/a of operative hysteroscopy/ resection of uterine septum, possible diagnostic laparoscopy.  Will proceed.     Pertinent Gynecological History: G1P0010 G1 ectopic - ruptured ectopic - RSO  10/19 last pap, h/o abn + STD - GC  Menstrual History:  Patient's last menstrual period was 09/09/2018 (exact date).    Past Medical History:  Diagnosis Date  . 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 History of ectopic pregnancy 09/2010   s/p  RSO  . History of gonorrhea 2009  . Uterus, septate     Past Surgical History:  Procedure Laterality Date  . LAPAROSCOPY  10/13/2010   Procedure: LAPAROSCOPY OPERATIVE;  Surgeon: Thornell Sartorius, MD;  Location: [facility];  Service: Gynecology;  Laterality: N/A;  Lysis of Adhesions. Right Salpingooophrectomy,Evacuation of Hematoperitoneum  . TONSILLECTOMY  child  . WISDOM TOOTH EXTRACTION      Family History  Problem Relation Age of Onset  . Hyperlipidemia Father   . Hypertension Father   . Hyperlipidemia Brother   . Hyperlipidemia Maternal Grandfather   . Hypertension Maternal Grandfather   . Parkinson's disease Maternal Grandfather   . Hyperlipidemia Mother   . Hyperlipidemia Maternal Grandmother   . Cancer Neg Hx   . Stroke Neg Hx   . Diabetes Neg Hx   . Heart disease Neg Hx     Social History:  reports that she has been smoking cigarettes. She has a 7.50 pack-year smoking history. She has never used smokeless tobacco. She reports previous alcohol use. She reports that she does not use drugs. singl, vet clinic  Allergies: No Known Allergies  Meds: Adderall    Review of Systems  Constitutional: Negative.   HENT: Negative.   Eyes: Negative.   Respiratory: Negative.   Cardiovascular: Negative.   Gastrointestinal: Negative.    Genitourinary: Negative.   Musculoskeletal: Negative.   Skin: Negative.   Neurological: Negative.   Psychiatric/Behavioral: Negative.     Height 5\' 5"  (1.651 m), weight 90.7 kg, last menstrual period 09/09/2018. Physical Exam  Constitutional: She is oriented to person, place, and time. She appears well-developed and well-nourished.  HENT:  Head: Normocephalic and atraumatic.  Cardiovascular: Normal rate and regular rhythm.  Respiratory: Effort normal and breath sounds normal. No respiratory distress. She has no wheezes.  GI: Soft. Bowel sounds are normal. She exhibits no distension. There is no abdominal tenderness.  Musculoskeletal: Normal range of motion.  Neurological: She is alert and oriented to person, place, and time.  Skin: Skin is warm and dry.  Psychiatric: She has a normal mood and affect. Her behavior is normal.   [place] - septate uterus, nl L ovary  Clinical photos from L/S RSO 2012 - uterus looks normal  Assessment/Plan: 33yo G1P0010 w septate uterus for resection of septum D/w pt r/b/a operative hysteroscopy resection of septum, poss laparoscopy Covid neg Plan for HSG postoperative  Adriana Hansen 09/27/2018, 9:14 PM

## 2018-09-27 NOTE — Anesthesia Preprocedure Evaluation (Addendum)
Anesthesia Evaluation  Patient identified by MRN, date of birth, ID band Patient awake    Reviewed: Allergy & Precautions, NPO status , Patient's Chart, lab work & pertinent test results  History of Anesthesia Complications Negative for: history of anesthetic complications  Airway Mallampati: II  TM Distance: >3 FB Neck ROM: Full    Dental  (+) Chipped,    Pulmonary Current Smoker,    Pulmonary exam normal        Cardiovascular negative cardio ROS Normal cardiovascular exam     Neuro/Psych ADD on Adderallnegative neurological ROS     GI/Hepatic negative GI ROS, Neg liver ROS,   Endo/Other  negative endocrine ROS  Renal/GU negative Renal ROS     Musculoskeletal negative musculoskeletal ROS (+)   Abdominal   Peds  Hematology negative hematology ROS (+)   Anesthesia Other Findings Day of surgery medications reviewed with the patient.  Reproductive/Obstetrics                            Anesthesia Physical Anesthesia Plan  ASA: II  Anesthesia Plan: General   Post-op Pain Management:    Induction: Intravenous  PONV Risk Score and Plan: 3 and Treatment may vary due to age or medical condition, Ondansetron, Dexamethasone, Midazolam and Scopolamine patch - Pre-op  Airway Management Planned: Oral ETT  Additional Equipment:   Intra-op Plan:   Post-operative Plan: Extubation in OR  Informed Consent: I have reviewed the patients History and Physical, chart, labs and discussed the procedure including the risks, benefits and alternatives for the proposed anesthesia with the patient or authorized representative who has indicated his/her understanding and acceptance.     Dental advisory given  Plan Discussed with: CRNA  Anesthesia Plan Comments:        Anesthesia Quick Evaluation

## 2018-09-28 ENCOUNTER — Ambulatory Visit (HOSPITAL_BASED_OUTPATIENT_CLINIC_OR_DEPARTMENT_OTHER): Payer: BC Managed Care – PPO | Admitting: Anesthesiology

## 2018-09-28 ENCOUNTER — Ambulatory Visit (HOSPITAL_BASED_OUTPATIENT_CLINIC_OR_DEPARTMENT_OTHER)
Admission: RE | Admit: 2018-09-28 | Discharge: 2018-09-28 | Disposition: A | Discharge status: Home / Self Care | Payer: BC Managed Care – PPO | Attending: Obstetrics and Gynecology | Admitting: Obstetrics and Gynecology

## 2018-09-28 ENCOUNTER — Encounter (HOSPITAL_BASED_OUTPATIENT_CLINIC_OR_DEPARTMENT_OTHER)
Admission: RE | Disposition: A | Discharge status: Home / Self Care | Payer: Self-pay | Source: Home / Self Care | Attending: Obstetrics and Gynecology

## 2018-09-28 ENCOUNTER — Other Ambulatory Visit: Payer: Self-pay

## 2018-09-28 ENCOUNTER — Encounter (HOSPITAL_BASED_OUTPATIENT_CLINIC_OR_DEPARTMENT_OTHER): Payer: Self-pay | Admitting: *Deleted

## 2018-09-28 DIAGNOSIS — N840 Polyp of corpus uteri: Secondary | ICD-10-CM | POA: Insufficient documentation

## 2018-09-28 DIAGNOSIS — N736 Female pelvic peritoneal adhesions (postinfective): Secondary | ICD-10-CM | POA: Diagnosis not present

## 2018-09-28 DIAGNOSIS — F988 Other specified behavioral and emotional disorders with onset usually occurring in childhood and adolescence: Secondary | ICD-10-CM | POA: Insufficient documentation

## 2018-09-28 DIAGNOSIS — Q512 Other doubling of uterus, unspecified: Secondary | ICD-10-CM | POA: Diagnosis not present

## 2018-09-28 DIAGNOSIS — Q5210 Doubling of vagina, unspecified: Secondary | ICD-10-CM | POA: Diagnosis not present

## 2018-09-28 DIAGNOSIS — F17210 Nicotine dependence, cigarettes, uncomplicated: Secondary | ICD-10-CM | POA: Diagnosis not present

## 2018-09-28 DIAGNOSIS — H18891 Other specified disorders of cornea, right eye: Secondary | ICD-10-CM | POA: Diagnosis not present

## 2018-09-28 HISTORY — PX: HYSTEROSCOPY WITH D & C: SHX1775

## 2018-09-28 HISTORY — PX: LAPAROSCOPY: SHX197

## 2018-09-28 HISTORY — PX: UTERINE SEPTUM RESECTION: SHX5386

## 2018-09-28 HISTORY — DX: Other and unspecified doubling of uterus: Q51.28

## 2018-09-28 LAB — POCT PREGNANCY, URINE: Preg Test, Ur: NEGATIVE

## 2018-09-28 SURGERY — RESECTION, SEPTUM, UTERUS
Anesthesia: General | Site: Uterus

## 2018-09-28 MED ORDER — SCOPOLAMINE 1 MG/3DAYS TD PT72
MEDICATED_PATCH | TRANSDERMAL | Status: AC
  Filled 2018-09-28: qty 1

## 2018-09-28 MED ORDER — DEXAMETHASONE SODIUM PHOSPHATE 10 MG/ML IJ SOLN
INTRAMUSCULAR | Status: DC | PRN
  Administered 2018-09-28: 5 mg via INTRAVENOUS

## 2018-09-28 MED ORDER — ONDANSETRON HCL 4 MG/2ML IJ SOLN
INTRAMUSCULAR | Status: AC
  Filled 2018-09-28: qty 2

## 2018-09-28 MED ORDER — KETOROLAC TROMETHAMINE 30 MG/ML IJ SOLN
INTRAMUSCULAR | Status: AC
  Filled 2018-09-28: qty 1

## 2018-09-28 MED ORDER — ONDANSETRON HCL 4 MG/2ML IJ SOLN
INTRAMUSCULAR | Status: DC | PRN
  Administered 2018-09-28: 4 mg via INTRAVENOUS

## 2018-09-28 MED ORDER — ACETAMINOPHEN 500 MG PO TABS
ORAL_TABLET | ORAL | Status: AC
  Filled 2018-09-28: qty 2

## 2018-09-28 MED ORDER — LIDOCAINE 2% (20 MG/ML) 5 ML SYRINGE
INTRAMUSCULAR | Status: DC | PRN
  Administered 2018-09-28: 100 mg via INTRAVENOUS

## 2018-09-28 MED ORDER — MIDAZOLAM HCL 2 MG/2ML IJ SOLN
INTRAMUSCULAR | Status: DC | PRN
  Administered 2018-09-28: 2 mg via INTRAVENOUS

## 2018-09-28 MED ORDER — PROMETHAZINE HCL 25 MG/ML IJ SOLN
6.2500 mg | INTRAMUSCULAR | Status: DC | PRN
  Filled 2018-09-28: qty 1

## 2018-09-28 MED ORDER — NORGESTIMATE-ETH ESTRADIOL 0.25-35 MG-MCG PO TABS: 1.0000 | ORAL_TABLET | Freq: Every day | ORAL | 11 refills | Status: DC

## 2018-09-28 MED ORDER — ACETAMINOPHEN 500 MG PO TABS
1000.0000 mg | ORAL_TABLET | Freq: Once | ORAL | Status: AC
  Administered 2018-09-28: 1000 mg via ORAL
  Filled 2018-09-28: qty 2

## 2018-09-28 MED ORDER — OXYCODONE HCL 5 MG PO TABS: 5.0000 mg | ORAL_TABLET | Freq: Four times a day (QID) | ORAL | 0 refills | Status: DC | PRN

## 2018-09-28 MED ORDER — FENTANYL CITRATE (PF) 100 MCG/2ML IJ SOLN
INTRAMUSCULAR | Status: DC | PRN
  Administered 2018-09-28: 100 ug via INTRAVENOUS
  Administered 2018-09-28 (×3): 50 ug via INTRAVENOUS

## 2018-09-28 MED ORDER — SCOPOLAMINE 1 MG/3DAYS TD PT72
1.0000 | MEDICATED_PATCH | TRANSDERMAL | Status: DC
  Administered 2018-09-28: 07:00:00 1.5 mg via TRANSDERMAL
  Filled 2018-09-28: qty 1

## 2018-09-28 MED ORDER — PROPOFOL 10 MG/ML IV BOLUS
INTRAVENOUS | Status: DC | PRN
  Administered 2018-09-28: 180 mg via INTRAVENOUS

## 2018-09-28 MED ORDER — BUPIVACAINE HCL (PF) 0.25 % IJ SOLN
INTRAMUSCULAR | Status: DC | PRN
  Administered 2018-09-28: 15 mL

## 2018-09-28 MED ORDER — SODIUM CHLORIDE 0.9 % IR SOLN
Status: DC | PRN
  Administered 2018-09-28: 3000 mL

## 2018-09-28 MED ORDER — KETOROLAC TROMETHAMINE 30 MG/ML IJ SOLN
INTRAMUSCULAR | Status: DC | PRN
  Administered 2018-09-28: 30 mg via INTRAVENOUS

## 2018-09-28 MED ORDER — SUGAMMADEX SODIUM 200 MG/2ML IV SOLN
INTRAVENOUS | Status: DC | PRN
  Administered 2018-09-28: 200 mg via INTRAVENOUS

## 2018-09-28 MED ORDER — OXYCODONE HCL 5 MG/5ML PO SOLN
5.0000 mg | Freq: Once | ORAL | Status: DC | PRN
  Filled 2018-09-28: qty 5

## 2018-09-28 MED ORDER — SUCCINYLCHOLINE CHLORIDE 200 MG/10ML IV SOSY
PREFILLED_SYRINGE | INTRAVENOUS | Status: AC
  Filled 2018-09-28: qty 10

## 2018-09-28 MED ORDER — MIDAZOLAM HCL 2 MG/2ML IJ SOLN
INTRAMUSCULAR | Status: AC
  Filled 2018-09-28: qty 2

## 2018-09-28 MED ORDER — FENTANYL CITRATE (PF) 100 MCG/2ML IJ SOLN
25.0000 ug | INTRAMUSCULAR | Status: DC | PRN
  Filled 2018-09-28: qty 1

## 2018-09-28 MED ORDER — LACTATED RINGERS IV SOLN
INTRAVENOUS | Status: DC
  Administered 2018-09-28 (×2): via INTRAVENOUS
  Filled 2018-09-28: qty 1000

## 2018-09-28 MED ORDER — ROCURONIUM BROMIDE 10 MG/ML (PF) SYRINGE
PREFILLED_SYRINGE | INTRAVENOUS | Status: AC
  Filled 2018-09-28: qty 10

## 2018-09-28 MED ORDER — PROPOFOL 10 MG/ML IV BOLUS
INTRAVENOUS | Status: AC
  Filled 2018-09-28: qty 40

## 2018-09-28 MED ORDER — IBUPROFEN 800 MG PO TABS: 800.0000 mg | ORAL_TABLET | Freq: Three times a day (TID) | ORAL | 1 refills | Status: DC | PRN

## 2018-09-28 MED ORDER — ROCURONIUM BROMIDE 10 MG/ML (PF) SYRINGE
PREFILLED_SYRINGE | INTRAVENOUS | Status: DC | PRN
  Administered 2018-09-28: 80 mg via INTRAVENOUS

## 2018-09-28 MED ORDER — DEXAMETHASONE SODIUM PHOSPHATE 10 MG/ML IJ SOLN
INTRAMUSCULAR | Status: AC
  Filled 2018-09-28: qty 1

## 2018-09-28 MED ORDER — LACTATED RINGERS IV SOLN
INTRAVENOUS | Status: DC
  Filled 2018-09-28: qty 1000

## 2018-09-28 MED ORDER — OXYCODONE HCL 5 MG PO TABS
5.0000 mg | ORAL_TABLET | Freq: Once | ORAL | Status: DC | PRN
  Filled 2018-09-28: qty 1

## 2018-09-28 MED ORDER — LIDOCAINE 2% (20 MG/ML) 5 ML SYRINGE
INTRAMUSCULAR | Status: AC
  Filled 2018-09-28: qty 5

## 2018-09-28 MED ORDER — FENTANYL CITRATE (PF) 250 MCG/5ML IJ SOLN
INTRAMUSCULAR | Status: AC
  Filled 2018-09-28: qty 5

## 2018-09-28 SURGICAL SUPPLY — 46 items
ADH SKN CLS APL DERMABOND .7 (GAUZE/BANDAGES/DRESSINGS) ×2
BIPOLAR CUTTING LOOP 21FR (ELECTRODE)
CABLE HIGH FREQUENCY MONO STRZ (ELECTRODE) ×1 IMPLANT
CANISTER SUCT 3000ML PPV (MISCELLANEOUS) ×2 IMPLANT
CATH ROBINSON RED A/P 16FR (CATHETERS) ×3 IMPLANT
COVER MAYO STAND STRL (DRAPES) IMPLANT
COVER WAND RF STERILE (DRAPES) ×3 IMPLANT
DERMABOND ADVANCED (GAUZE/BANDAGES/DRESSINGS) ×1
DERMABOND ADVANCED .7 DNX12 (GAUZE/BANDAGES/DRESSINGS) ×2 IMPLANT
DILATOR CANAL MILEX (MISCELLANEOUS) IMPLANT
DRSG OPSITE POSTOP 3X4 (GAUZE/BANDAGES/DRESSINGS) IMPLANT
DURAPREP 26ML APPLICATOR (WOUND CARE) ×3 IMPLANT
GAUZE 4X4 16PLY RFD (DISPOSABLE) ×3 IMPLANT
GAUZE VASELINE 3X9 (GAUZE/BANDAGES/DRESSINGS) ×2 IMPLANT
GLOVE BIO SURGEON STRL SZ 6.5 (GLOVE) ×3 IMPLANT
GLOVE BIOGEL PI IND STRL 7.5 (GLOVE) IMPLANT
GLOVE BIOGEL PI INDICATOR 7.5 (GLOVE) ×1
GOWN STRL REUS W/ TWL LRG LVL3 (GOWN DISPOSABLE) ×2 IMPLANT
GOWN STRL REUS W/TWL LRG LVL3 (GOWN DISPOSABLE) ×9 IMPLANT
IV NS IRRIG 3000ML ARTHROMATIC (IV SOLUTION) ×1 IMPLANT
KIT PROCEDURE FLUENT (KITS) ×3 IMPLANT
KIT TURNOVER CYSTO (KITS) ×3 IMPLANT
LOOP CUTTING BIPOLAR 21FR (ELECTRODE) IMPLANT
NEEDLE INSUFFLATION 120MM (ENDOMECHANICALS) ×3 IMPLANT
NS IRRIG 500ML POUR BTL (IV SOLUTION) ×3 IMPLANT
PACK LAPAROSCOPY BASIN (CUSTOM PROCEDURE TRAY) ×3 IMPLANT
PACK VAGINAL MINOR WOMEN LF (CUSTOM PROCEDURE TRAY) ×3 IMPLANT
PAD OB MATERNITY 4.3X12.25 (PERSONAL CARE ITEMS) ×3 IMPLANT
PAD PREP 24X48 CUFFED NSTRL (MISCELLANEOUS) ×3 IMPLANT
SET IRRIG TUBING LAPAROSCOPIC (IRRIGATION / IRRIGATOR) IMPLANT
SHEARS HARMONIC ACE PLUS 36CM (ENDOMECHANICALS) IMPLANT
SUT VIC AB 2-0 CT2 27 (SUTURE) IMPLANT
SUT VICRYL 0 UR6 27IN ABS (SUTURE) IMPLANT
SUT VICRYL 4-0 PS2 18IN ABS (SUTURE) ×3 IMPLANT
SYR 20CC LL (SYRINGE) IMPLANT
SYR 50ML LL SCALE MARK (SYRINGE) ×1 IMPLANT
SYS BAG RETRIEVAL 10MM (BASKET)
SYSTEM BAG RETRIEVAL 10MM (BASKET) IMPLANT
TOWEL OR 17X26 10 PK STRL BLUE (TOWEL DISPOSABLE) ×5 IMPLANT
TRAY FOLEY BAG SILVER LF 14FR (CATHETERS) ×3 IMPLANT
TROCAR BALLN 12MMX100 BLUNT (TROCAR) IMPLANT
TROCAR XCEL NON-BLD 11X100MML (ENDOMECHANICALS) IMPLANT
TROCAR XCEL NON-BLD 5MMX100MML (ENDOMECHANICALS) ×6 IMPLANT
TUBING EVAC SMOKE HEATED PNEUM (TUBING) ×3 IMPLANT
WARMER LAPAROSCOPE (MISCELLANEOUS) ×3 IMPLANT
WATER STERILE IRR 500ML POUR (IV SOLUTION) ×2 IMPLANT

## 2018-09-28 NOTE — Anesthesia Postprocedure Evaluation (Signed)
Anesthesia Post Note  Patient: Adriana Hansen  Procedure(s) Performed: RESECTION OF UTERINE SEPTUM (N/A Abdomen) DILATATION AND CURETTAGE /HYSTEROSCOPY (N/A Uterus) LAPAROSCOPY DIAGNOSTIC (N/A Abdomen)     Patient location during evaluation: PACU Anesthesia Type: General Level of consciousness: awake and alert Pain management: pain level controlled Vital Signs Assessment: post-procedure vital signs reviewed and stable Respiratory status: spontaneous breathing, nonlabored ventilation and respiratory function stable Cardiovascular status: blood pressure returned to baseline and stable Postop Assessment: no apparent nausea or vomiting Anesthetic complications: no    Last Vitals:  Vitals:   09/28/18 0945 09/28/18 1007  BP:  104/62  Pulse:  79  Resp:  16  Temp:  36.6 C  SpO2: 100% 100%    Last Pain:  Vitals:   09/28/18 0945  TempSrc:   PainSc: Bear

## 2018-09-28 NOTE — Interval H&P Note (Signed)
History and Physical Interval Note:  09/28/2018 7:03 AM  Adriana Hansen  has presented today for surgery, with the diagnosis of septate uterus.  The various methods of treatment have been discussed with the patient and family. After consideration of risks, benefits and other options for treatment, the patient has consented to  Procedure(s) with comments: RESECTION OF UTERINE SEPTUM (N/A) - request RNFA or extra tech DILATATION AND CURETTAGE /HYSTEROSCOPY (N/A) LAPAROSCOPY DIAGNOSTIC (N/A) - possible as a surgical intervention.  The patient's history has been reviewed, patient examined, no change in status, stable for surgery.  I have reviewed the patient's chart and labs.  Questions were answered to the patient's satisfaction.     [name]

## 2018-09-28 NOTE — Discharge Instructions (Signed)
DISCHARGE INSTRUCTIONS: Laparoscopy ° °The following instructions have been prepared to help you care for yourself upon your return home today. ° °Wound care: °• Do not get the incision wet for the first 24 hours. The incision should be kept clean and dry. °• The Band-Aids or dressings may be removed the day after surgery. °• Should the incision become sore, red, and swollen after the first week, check with your doctor. ° °Personal hygiene: °• Shower the day after your procedure. ° °Activity and limitations: °• Do NOT drive or operate any equipment today. °• Do NOT lift anything more than 15 pounds for 2-3 weeks after surgery. °• Do NOT rest in bed all day. °• Walking is encouraged. Walk each day, starting slowly with 5-minute walks 3 or 4 times a day. Slowly increase the length of your walks. °• Walk up and down stairs slowly. °• Do NOT do strenuous activities, such as golfing, playing tennis, bowling, running, biking, weight lifting, gardening, mowing, or vacuuming for 2-4 weeks. Ask your doctor when it is okay to start. ° °Diet: Eat a light meal as desired this evening. You may resume your usual diet tomorrow. ° °Return to work: This is dependent on the type of work you do. For the most part you can return to a desk job within a week of surgery. If you are more active at work, please discuss this with your doctor. ° °What to expect after your surgery: You may have a slight burning sensation when you urinate on the first day. You may have a very small amount of blood in the urine. Expect to have a small amount of vaginal discharge/light bleeding for 1-2 weeks. It is not unusual to have abdominal soreness and bruising for up to 2 weeks. You may be tired and need more rest for about 1 week. You may experience shoulder pain for 24-72 hours. Lying flat in bed may relieve it. ° °Call your doctor for any of the following: °• Develop a fever of 100.4 or greater °• Inability to urinate 6 hours after discharge from  hospital °• Severe pain not relieved by pain medications °• Persistent of heavy bleeding at incision site °• Redness or swelling around incision site after a week °• Increasing nausea or vomiting ° °Patient Signature________________________________________ °Nurse Signature_________________________________________ °Post Anesthesia Home Care Instructions ° °Activity: °Get plenty of rest for the remainder of the day. A responsible individual must stay with you for 24 hours following the procedure.  °For the next 24 hours, DO NOT: °-Drive a car °-Operate machinery °-Drink alcoholic beverages °-Take any medication unless instructed by your physician °-Make any legal decisions or sign important papers. ° °Meals: °Start with liquid foods such as gelatin or soup. Progress to regular foods as tolerated. Avoid greasy, spicy, heavy foods. If nausea and/or vomiting occur, drink only clear liquids until the nausea and/or vomiting subsides. Call your physician if vomiting continues. ° °Special Instructions/Symptoms: °Your throat may feel dry or sore from the anesthesia or the breathing tube placed in your throat during surgery. If this causes discomfort, gargle with warm salt water. The discomfort should disappear within 24 hours. ° °If you had a scopolamine patch placed behind your ear for the management of post- operative nausea and/or vomiting: ° °1. The medication in the patch is effective for 72 hours, after which it should be removed.  Wrap patch in a tissue and discard in the trash. Wash hands thoroughly with soap and water. °2. You may remove the patch   may remove the patch earlier than 72 hours if you experience unpleasant side effects which may include dry mouth, dizziness or visual disturbances. 3. Avoid touching the patch. Wash your hands with soap and water after contact with the patch.      NO IBUPROFEN PRODUCTS (MOTRIN,ADVIL) OR ALEVE UNTIL 2:50PM TODAY.

## 2018-09-28 NOTE — Brief Op Note (Signed)
09/28/2018  9:07 AM  PATIENT:  Adriana Hansen  33 y.o. female  PRE-OPERATIVE DIAGNOSIS:  septate uterus  POST-OPERATIVE DIAGNOSIS:  septate uterus, tubal adhesion to sidewall and left ovary.  R cornual adhesion  PROCEDURE:  Procedure(s) with comments: RESECTION OF UTERINE SEPTUM (N/A) - request RNFA or extra tech DILATATION AND CURETTAGE /HYSTEROSCOPY (N/A) LAPAROSCOPY DIAGNOSTIC (N/A) - possible  SURGEON:  Surgeon(s) and Role:    * Bovard-Stuckert, Maryl Blalock, MD - Primary  ASSISTANTS: Gaylord Shih RNFA   ANESTHESIA:   local and general  EBL:  25 mL IVF and uop per anesthesia.     FINDINGS: uterine septum; R cornual to omental adhesion, L tubal, ovaria, sidewall adhesions.    BLOOD ADMINISTERED:none  DRAINS: none   LOCAL MEDICATIONS USED:  MARCAINE     SPECIMEN:  Source of Specimen:  endometrial currettings/resection of septum  DISPOSITION OF SPECIMEN:  PATHOLOGY  COUNTS:  YES  TOURNIQUET:  * No tourniquets in log *  DICTATION: .Other Dictation: Dictation Number [id number]  PLAN OF CARE: Discharge to home after PACU  PATIENT DISPOSITION:  PACU - hemodynamically stable.   Delay start of Pharmacological VTE agent (>24hrs) due to surgical blood loss or risk of bleeding: not applicable

## 2018-09-28 NOTE — Anesthesia Procedure Notes (Signed)
Procedure Name: Intubation Date/Time: 09/28/2018 7:27 AM Performed by: Wanita Chamberlain, CRNA Pre-anesthesia Checklist: Patient identified, Emergency Drugs available, Suction available and Patient being monitored Patient Re-evaluated:Patient Re-evaluated prior to induction Oxygen Delivery Method: Circle system utilized Preoxygenation: Pre-oxygenation with 100% oxygen Induction Type: IV induction Ventilation: Mask ventilation without difficulty Laryngoscope Size: Mac and 3 Grade View: Grade I Tube type: Oral Tube size: 7.0 mm Number of attempts: 1 Airway Equipment and Method: Oral airway Placement Confirmation: ETT inserted through vocal cords under direct vision,  positive ETCO2,  CO2 detector and breath sounds checked- equal and bilateral Secured at: 22 cm Tube secured with: Tape Dental Injury: Teeth and Oropharynx as per pre-operative assessment

## 2018-09-28 NOTE — Transfer of Care (Signed)
Immediate Anesthesia Transfer of Care Note  Patient: IDABELL PICKING  Procedure(s) Performed: RESECTION OF UTERINE SEPTUM (N/A ) DILATATION AND CURETTAGE /HYSTEROSCOPY (N/A ) LAPAROSCOPY DIAGNOSTIC (N/A )  Patient Location: PACU  Anesthesia Type:General  Level of Consciousness: awake, alert , oriented and patient cooperative  Airway & Oxygen Therapy: Patient Spontanous Breathing and Patient connected to nasal cannula oxygen  Post-op Assessment: Report given to RN and Post -op Vital signs reviewed and stable  Post vital signs: Reviewed and stable  Last Vitals:  Vitals Value Taken Time  BP    Temp    Pulse 88 09/28/18 0911  Resp    SpO2 98 % 09/28/18 0911  Vitals shown include unvalidated device data.  Last Pain:  Vitals:   09/28/18 0620  TempSrc:   PainSc: 0-No pain      Patients Stated Pain Goal: 8 (29/56/21 3086)  Complications: No apparent anesthesia complications

## 2018-09-28 NOTE — Op Note (Signed)
NAME: Adriana Hansen, Adriana Hansen MEDICAL RECORD [id number] ACCOUNT [id number] DATE OF BIRTH:10-Oct-1985 FACILITY: [facility] LOCATION: [facility] PHYSICIAN:Pau Banh BOVARD-STUCKERT, MD  OPERATIVE REPORT  DATE OF PROCEDURE:  09/28/2018  PREOPERATIVE DIAGNOSIS:  Septate uterus.  POSTOPERATIVE DIAGNOSIS:  Septate uterus, tubal adhesion to the sidewall and left ovary and right corneal adhesion.  PROCEDURE:  Operative hysteroscopy, resection of uterine septum, and operative laparoscopy with lysis of adhesions.  SURGEON:  Janyth Contes, MD  ASSISTANT:  Sullivan Lone, RNFA  ANESTHESIA:  Local and general.  ESTIMATED BLOOD LOSS:  Approximately 25 mL.  URINE OUTPUT AND INTRAVENOUS FLUIDS:  Per anesthesia.  FINDINGS:  Uterine septum, right corneal to omental adhesion, left tubo-ovarian and sidewall adhesions as well as omental.  COMPLICATIONS:  None.  PATHOLOGY:  Endometrial curettings/resection of septum.  COMPLICATIONS:  None.  DESCRIPTION OF PROCEDURE:  After informed consent was reviewed with the patient including risks, benefits and alternatives of the surgical procedure, she was transported to the operating room and placed on the table in supine position.  General  anesthesia was induced and found to be adequate.  She was then prepped and draped in the normal sterile fashion.  She was placed in [place], and a Hulka manipulator was placed in her uterus.  Gloves and gown were changed.  Attention was turned  to the abdominal portion of the case.  An approximately 1 cm vertical infraumbilical incision was made, and using the Veress needle, a pneumoperitoneum was obtained.  Then, the port was placed under direct visualization.  The opening pressure was 2 mmHg.   The brief pelvic survey revealed normal liver edge.  Unable to see the appendix.  Right tube and ovary had previously been removed due to a ruptured ectopic pregnancy.  There were minimal uterine adhesions and a complex that was  left ovary to  sidewall and omental adhesions.  An accessory port was placed on the left under direct visualization.  A 5 mm port was placed using bipolar scissors.  The corneal adhesion was lysed. An attempt was made to better delineate the adhesions on the left, and  the decision was made to not proceed.  She will have an HSG following surgery to better delineate what her next course of action will be.  The laparoscope was maintained, and the hysteroscope was introduced into her uterus after her cervix was dilated  to accommodate the operative scope.  The uterine survey revealed bilateral ostia and a moderate to large septum.  This was resected with scissors while watching with the laparoscope.  The septum was resected to restore normal anatomy.  Minimal bleeding.   The instruments were removed from her vagina.  Gloves and gown were changed.  Attention was turned to the abdominal portion.  Survey revealed no further bleeding.  The ports were removed under direct visualization.  The pneumoperitoneum was evacuated.   The ports were closed with 4-0 Vicryl and Dermabond.  The patient tolerated the procedure well.  Sponge, lap and needle counts were correct x2 per the operating staff.  LN/NUANCE  D:09/28/2018 T:09/28/2018 JOB:[id number]

## 2018-09-30 ENCOUNTER — Other Ambulatory Visit: Payer: Self-pay | Admitting: Internal Medicine

## 2018-09-30 DIAGNOSIS — F988 Other specified behavioral and emotional disorders with onset usually occurring in childhood and adolescence: Secondary | ICD-10-CM

## 2018-10-02 ENCOUNTER — Encounter (HOSPITAL_BASED_OUTPATIENT_CLINIC_OR_DEPARTMENT_OTHER): Payer: Self-pay | Admitting: Obstetrics and Gynecology

## 2018-10-02 MED ORDER — AMPHETAMINE-DEXTROAMPHETAMINE 15 MG PO TABS: 15.0000 mg | ORAL_TABLET | Freq: Two times a day (BID) | ORAL | 0 refills | Status: DC

## 2018-10-02 NOTE — Telephone Encounter (Signed)
Check [place] registry last filled 09/04/2018.Marland KitchenJohny Chess

## 2018-10-25 ENCOUNTER — Other Ambulatory Visit: Payer: Self-pay | Admitting: Internal Medicine

## 2018-10-25 DIAGNOSIS — F988 Other specified behavioral and emotional disorders with onset usually occurring in childhood and adolescence: Secondary | ICD-10-CM

## 2018-10-25 MED ORDER — AMPHETAMINE-DEXTROAMPHETAMINE 15 MG PO TABS: 15.0000 mg | ORAL_TABLET | Freq: Two times a day (BID) | ORAL | 0 refills | Status: DC

## 2018-10-25 NOTE — Telephone Encounter (Signed)
Check [place] registry last filled 10/02/2018.Marland KitchenJohny Chess

## 2018-11-02 DIAGNOSIS — Z3141 Encounter for fertility testing: Secondary | ICD-10-CM | POA: Diagnosis not present

## 2018-11-22 ENCOUNTER — Other Ambulatory Visit: Payer: Self-pay | Admitting: Internal Medicine

## 2018-11-22 DIAGNOSIS — F988 Other specified behavioral and emotional disorders with onset usually occurring in childhood and adolescence: Secondary | ICD-10-CM

## 2018-11-23 MED ORDER — AMPHETAMINE-DEXTROAMPHETAMINE 15 MG PO TABS: 15.0000 mg | ORAL_TABLET | Freq: Two times a day (BID) | ORAL | 0 refills | Status: DC

## 2018-11-23 NOTE — Telephone Encounter (Signed)
Check [place] registry last filled 10/30/2018.Marland KitchenJohny Chess

## 2018-12-21 ENCOUNTER — Other Ambulatory Visit: Payer: Self-pay | Admitting: Internal Medicine

## 2018-12-21 DIAGNOSIS — F988 Other specified behavioral and emotional disorders with onset usually occurring in childhood and adolescence: Secondary | ICD-10-CM

## 2018-12-21 MED ORDER — AMPHETAMINE-DEXTROAMPHETAMINE 15 MG PO TABS: 15.0000 mg | ORAL_TABLET | Freq: Two times a day (BID) | ORAL | 0 refills | Status: DC

## 2018-12-21 NOTE — Telephone Encounter (Signed)
Last OV 02/02/18 Next OV 02/19/19 Last RF 11/27/18

## 2019-01-09 DIAGNOSIS — Z1389 Encounter for screening for other disorder: Secondary | ICD-10-CM | POA: Diagnosis not present

## 2019-01-09 DIAGNOSIS — Z130 Encounter for screening for diseases of the blood and blood-forming organs and certain disorders involving the immune mechanism: Secondary | ICD-10-CM | POA: Diagnosis not present

## 2019-01-09 DIAGNOSIS — Z6833 Body mass index (BMI) 33.0-33.9, adult: Secondary | ICD-10-CM | POA: Diagnosis not present

## 2019-01-09 DIAGNOSIS — Z01419 Encounter for gynecological examination (general) (routine) without abnormal findings: Secondary | ICD-10-CM | POA: Diagnosis not present

## 2019-01-17 ENCOUNTER — Other Ambulatory Visit: Payer: Self-pay | Admitting: Internal Medicine

## 2019-01-17 DIAGNOSIS — F988 Other specified behavioral and emotional disorders with onset usually occurring in childhood and adolescence: Secondary | ICD-10-CM

## 2019-01-18 MED ORDER — AMPHETAMINE-DEXTROAMPHETAMINE 15 MG PO TABS: 15.0000 mg | ORAL_TABLET | Freq: Two times a day (BID) | ORAL | 0 refills | Status: DC

## 2019-01-18 NOTE — Telephone Encounter (Signed)
Check [place] registry last filled 12/21/2018.Marland KitchenJohny Chess

## 2019-01-26 DIAGNOSIS — Z319 Encounter for procreative management, unspecified: Secondary | ICD-10-CM | POA: Diagnosis not present

## 2019-01-26 DIAGNOSIS — Q5128 Other doubling of uterus, other specified: Secondary | ICD-10-CM | POA: Diagnosis not present

## 2019-02-02 ENCOUNTER — Other Ambulatory Visit: Payer: Self-pay

## 2019-02-02 DIAGNOSIS — Z20822 Contact with and (suspected) exposure to covid-19: Secondary | ICD-10-CM

## 2019-02-03 LAB — NOVEL CORONAVIRUS, NAA: SARS-CoV-2, NAA: NOT DETECTED

## 2019-02-15 ENCOUNTER — Other Ambulatory Visit: Payer: Self-pay | Admitting: Internal Medicine

## 2019-02-15 DIAGNOSIS — F988 Other specified behavioral and emotional disorders with onset usually occurring in childhood and adolescence: Secondary | ICD-10-CM

## 2019-02-15 MED ORDER — AMPHETAMINE-DEXTROAMPHETAMINE 15 MG PO TABS: 15.0000 mg | ORAL_TABLET | Freq: Two times a day (BID) | ORAL | 0 refills | Status: DC

## 2019-02-15 NOTE — Telephone Encounter (Signed)
Check  registry last filled 01/18/2019../lmb  

## 2019-02-19 ENCOUNTER — Ambulatory Visit: Payer: BLUE CROSS/BLUE SHIELD | Admitting: Internal Medicine

## 2019-02-27 DIAGNOSIS — Z01818 Encounter for other preprocedural examination: Secondary | ICD-10-CM | POA: Diagnosis not present

## 2019-03-01 ENCOUNTER — Ambulatory Visit: Payer: BC Managed Care – PPO | Admitting: Internal Medicine

## 2019-03-02 DIAGNOSIS — Q5128 Other doubling of uterus, other specified: Secondary | ICD-10-CM | POA: Diagnosis not present

## 2019-03-02 DIAGNOSIS — Z793 Long term (current) use of hormonal contraceptives: Secondary | ICD-10-CM | POA: Diagnosis not present

## 2019-03-02 DIAGNOSIS — F17200 Nicotine dependence, unspecified, uncomplicated: Secondary | ICD-10-CM | POA: Diagnosis not present

## 2019-03-02 DIAGNOSIS — Z79899 Other long term (current) drug therapy: Secondary | ICD-10-CM | POA: Diagnosis not present

## 2019-03-12 NOTE — Patient Instructions (Addendum)
   Medications reviewed and updated.  Changes include :   none  Your prescription(s) have been submitted to your pharmacy. Please take as directed and contact our office if you believe you are having problem(s) with the medication(s).    Please followup in 6 months   

## 2019-03-12 NOTE — Progress Notes (Signed)
Subjective:    Patient ID: Adriana Hansen, female    DOB: [birth year], 33 y.o.   MRN: [id number]  HPI The patient is here for follow up.  She is active at work, but not exercising regularly.    ADD:  She is taking her medication as prescribed.  She feels the medication is effective.  She denies side effects, including palpitations, headaches, lightheadedness, decreased appetite and weight loss.    Medications and allergies reviewed with patient and updated if appropriate.  Patient Active Problem List   Diagnosis Date Noted  . Onychomycosis 09/12/2017  . Contact dermatitis 08/29/2017  . Tobacco abuse 08/02/2017  . Toenail deformity 08/02/2017  . Obesity (BMI 30.0-34.9) 02/02/2017  . Numbness of fingers of both hands 02/07/2015  . Attention deficit disorder 09/01/2006    Current Outpatient Medications on File Prior to Visit  Medication Sig Dispense Refill  . estradiol (ESTRACE) 2 MG tablet Take by mouth.     No current facility-administered medications on file prior to visit.    Past Medical History:  Diagnosis Date  . 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 History of ectopic pregnancy 09/2010   s/p  RSO  . History of gonorrhea 2009  . Uterus, septate     Past Surgical History:  Procedure Laterality Date  . HYSTEROSCOPY W/D&C N/A 09/28/2018   Procedure: DILATATION AND CURETTAGE /HYSTEROSCOPY;  Surgeon: Sherian Rein, MD;  Location: [facility];  Service: Gynecology;  Laterality: N/A;  . LAPAROSCOPY  10/13/2010   Procedure: LAPAROSCOPY OPERATIVE;  Surgeon: Sherron Monday, MD;  Location: [facility];  Service: Gynecology;  Laterality: N/A;  Lysis of Adhesions. Right Salpingooophrectomy,Evacuation of Hematoperitoneum  . LAPAROSCOPY N/A 09/28/2018   Procedure: LAPAROSCOPY DIAGNOSTIC;  Surgeon: Sherian Rein, MD;  Location[facility];  Service: Gynecology;  Laterality: N/A;  possible  .  TONSILLECTOMY  child  . UTERINE SEPTUM RESECTION N/A 09/28/2018   Procedure: RESECTION OF UTERINE SEPTUM;  Surgeon: Sherian Rein, MD;  Location: [facility];  Service: Gynecology;  Laterality: N/A;  request RNFA or extra tech  . WISDOM TOOTH EXTRACTION      Social History   Socioeconomic History  . Marital status: Single    Spouse name: Not on file  . Number of children: Not on file  . Years of education: Not on file  . Highest education level: Not on file  Occupational History  . Not on file  Tobacco Use  . Smoking status: Current Every Day Smoker    Packs/day: 0.50    Years: 15.00    Pack years: 7.50    Types: Cigarettes  . Smokeless tobacco: Never Used  Substance and Sexual Activity  . Alcohol use: Not Currently    Comment: rare  . Drug use: Never  . Sexual activity: Yes    Birth control/protection: None  Other Topics Concern  . Not on file  Social History Narrative   Acute, no regular exercise   Social Determinants of Health   Financial Resource Strain:   . Difficulty of Paying Living Expenses: Not on file  Food Insecurity:   . Worried About Programme researcher, broadcasting/film/video in the Last Year: Not on file  . Ran Out of Food in the Last Year: Not on file  Transportation Needs:   . Lack of Transportation (Medical): Not on file  . Lack of Transportation (Non-Medical): Not on file  Physical Activity:   . Days  of Exercise per Week: Not on file  . Minutes of Exercise per Session: Not on file  Stress:   . Feeling of Stress : Not on file  Social Connections:   . Frequency of Communication with Friends and Family: Not on file  . Frequency of Social Gatherings with Friends and Family: Not on file  . Attends Religious Services: Not on file  . Active Member of Clubs or Organizations: Not on file  . Attends Archivist Meetings: Not on file  . Marital Status: Not on file    Family History  Problem Relation Age of Onset  . Hyperlipidemia Father   .  Hypertension Father   . Hyperlipidemia Brother   . Hyperlipidemia Maternal Grandfather   . Hypertension Maternal Grandfather   . Parkinson's disease Maternal Grandfather   . Hyperlipidemia Mother   . Hyperlipidemia Maternal Grandmother   . Cancer Neg Hx   . Stroke Neg Hx   . Diabetes Neg Hx   . Heart disease Neg Hx     Review of Systems Per HPI    Objective:   Vitals:   03/13/19 1400  BP: 134/70  Pulse: (!) 107  Resp: 16  Temp: 99.1 F (37.3 C)  SpO2: 98%   BP Readings from Last 3 Encounters:  03/13/19 134/70  09/28/18 116/74  08/17/18 134/72   Wt Readings from Last 3 Encounters:  03/13/19 199 lb 12.8 oz (90.6 kg)  09/28/18 203 lb 8 oz (92.3 kg)  08/17/18 205 lb (93 kg)   Body mass index is 33.25 kg/m.   Physical Exam Constitutional:      General: She is not in acute distress.    Appearance: Normal appearance. She is not ill-appearing.  Skin:    General: Skin is warm and dry.  Neurological:     Mental Status: She is alert.  Psychiatric:        Mood and Affect: Mood normal.        Behavior: Behavior normal.        Thought Content: Thought content normal.        Judgment: Judgment normal.            Assessment & Plan:    See Problem List for Assessment and Plan of chronic medical problems.    This visit occurred during the SARS-CoV-2 public health emergency.  Safety protocols were in place, including screening questions prior to the visit, additional usage of staff PPE, and extensive cleaning of exam room while observing appropriate contact time as indicated for disinfecting solutions.

## 2019-03-13 ENCOUNTER — Ambulatory Visit (INDEPENDENT_AMBULATORY_CARE_PROVIDER_SITE_OTHER): Payer: BC Managed Care – PPO | Admitting: Internal Medicine

## 2019-03-13 ENCOUNTER — Other Ambulatory Visit: Payer: Self-pay

## 2019-03-13 ENCOUNTER — Encounter: Payer: Self-pay | Admitting: Internal Medicine

## 2019-03-13 VITALS — BP 134/70 | HR 107 | Temp 99.1°F | Resp 16 | Ht 65.0 in | Wt 199.8 lb

## 2019-03-13 DIAGNOSIS — F988 Other specified behavioral and emotional disorders with onset usually occurring in childhood and adolescence: Secondary | ICD-10-CM

## 2019-03-13 MED ORDER — AMPHETAMINE-DEXTROAMPHETAMINE 15 MG PO TABS: 15.0000 mg | ORAL_TABLET | Freq: Two times a day (BID) | ORAL | 0 refills | Status: DC

## 2019-03-13 NOTE — Assessment & Plan Note (Signed)
Controlled, stable No side effects Continue current dose of medication Adderall 15 mg twice daily - refill sent today

## 2019-04-16 ENCOUNTER — Other Ambulatory Visit: Payer: Self-pay | Admitting: Internal Medicine

## 2019-04-16 DIAGNOSIS — F988 Other specified behavioral and emotional disorders with onset usually occurring in childhood and adolescence: Secondary | ICD-10-CM

## 2019-04-19 ENCOUNTER — Other Ambulatory Visit: Payer: Self-pay | Admitting: Internal Medicine

## 2019-04-19 DIAGNOSIS — F988 Other specified behavioral and emotional disorders with onset usually occurring in childhood and adolescence: Secondary | ICD-10-CM

## 2019-04-21 ENCOUNTER — Other Ambulatory Visit: Payer: Self-pay | Admitting: Internal Medicine

## 2019-04-21 DIAGNOSIS — F988 Other specified behavioral and emotional disorders with onset usually occurring in childhood and adolescence: Secondary | ICD-10-CM

## 2019-04-23 ENCOUNTER — Other Ambulatory Visit: Payer: Self-pay | Admitting: Internal Medicine

## 2019-04-23 DIAGNOSIS — F988 Other specified behavioral and emotional disorders with onset usually occurring in childhood and adolescence: Secondary | ICD-10-CM

## 2019-04-23 NOTE — Telephone Encounter (Signed)
Patient has requested this med refill for 3 days.  Please follow up in regard.

## 2019-04-24 MED ORDER — AMPHETAMINE-DEXTROAMPHETAMINE 15 MG PO TABS: 15.0000 mg | ORAL_TABLET | Freq: Two times a day (BID) | ORAL | 0 refills | Status: DC

## 2019-04-24 NOTE — Telephone Encounter (Signed)
Last OV was 03/13/19 Next OV 09/11/19 Last RF 03/18/19

## 2019-04-29 NOTE — Telephone Encounter (Signed)
It wont let me refuse it.  

## 2019-04-30 NOTE — Telephone Encounter (Signed)
Check [place] registry last filled 04/24/2019.Marland KitchenRaechel Chute

## 2019-04-30 NOTE — Telephone Encounter (Signed)
Duplicate request glitch in system already done.Marland KitchenRaechel Hansen

## 2019-05-04 DIAGNOSIS — U071 COVID-19: Secondary | ICD-10-CM | POA: Diagnosis not present

## 2019-05-24 ENCOUNTER — Other Ambulatory Visit: Payer: Self-pay | Admitting: Internal Medicine

## 2019-05-24 DIAGNOSIS — F988 Other specified behavioral and emotional disorders with onset usually occurring in childhood and adolescence: Secondary | ICD-10-CM

## 2019-05-24 MED ORDER — AMPHETAMINE-DEXTROAMPHETAMINE 15 MG PO TABS: 15.0000 mg | ORAL_TABLET | Freq: Two times a day (BID) | ORAL | 0 refills | Status: DC

## 2019-06-25 ENCOUNTER — Other Ambulatory Visit: Payer: Self-pay | Admitting: Internal Medicine

## 2019-06-25 DIAGNOSIS — F988 Other specified behavioral and emotional disorders with onset usually occurring in childhood and adolescence: Secondary | ICD-10-CM

## 2019-06-25 MED ORDER — AMPHETAMINE-DEXTROAMPHETAMINE 15 MG PO TABS: 15.0000 mg | ORAL_TABLET | Freq: Two times a day (BID) | ORAL | 0 refills | Status: DC

## 2019-06-25 NOTE — Telephone Encounter (Signed)
Last RF 05/24/19 Last OV 03/13/19 Next OV 09/11/19

## 2019-07-24 ENCOUNTER — Other Ambulatory Visit: Payer: Self-pay | Admitting: Internal Medicine

## 2019-07-24 DIAGNOSIS — F988 Other specified behavioral and emotional disorders with onset usually occurring in childhood and adolescence: Secondary | ICD-10-CM

## 2019-07-25 MED ORDER — AMPHETAMINE-DEXTROAMPHETAMINE 15 MG PO TABS: 15.0000 mg | ORAL_TABLET | Freq: Two times a day (BID) | ORAL | 0 refills | Status: DC

## 2019-07-25 NOTE — Telephone Encounter (Signed)
Check [place] registry last filled 06/26/2019.Marland KitchenRaechel Chute

## 2019-07-27 ENCOUNTER — Ambulatory Visit: Payer: BC Managed Care – PPO | Attending: Internal Medicine

## 2019-07-27 DIAGNOSIS — Z319 Encounter for procreative management, unspecified: Secondary | ICD-10-CM | POA: Diagnosis not present

## 2019-08-06 DIAGNOSIS — Q5128 Other doubling of uterus, other specified: Secondary | ICD-10-CM | POA: Diagnosis not present

## 2019-08-11 ENCOUNTER — Ambulatory Visit: Payer: BC Managed Care – PPO

## 2019-08-23 ENCOUNTER — Telehealth: Payer: Self-pay | Admitting: Internal Medicine

## 2019-08-23 DIAGNOSIS — F988 Other specified behavioral and emotional disorders with onset usually occurring in childhood and adolescence: Secondary | ICD-10-CM

## 2019-08-23 MED ORDER — AMPHETAMINE-DEXTROAMPHETAMINE 15 MG PO TABS: 15.0000 mg | ORAL_TABLET | Freq: Two times a day (BID) | ORAL | 0 refills | Status: DC

## 2019-08-23 NOTE — Telephone Encounter (Signed)
Last OV 03/12/20 Next OV 09/11/19 Last RF 07/25/19

## 2019-08-23 NOTE — Telephone Encounter (Signed)
New Message:   1.Medication Requested: amphetamine-dextroamphetamine (ADDERALL) 15 MG tablet 2. Pharmacy (Name, Street, [place]): [facility] [address] [place], [place] [address] [phone] 3. On Med List: Yes  4. Last Visit with PCP: 03/13/19  5. Next visit date with PCP: 09/11/19   Pt would like it sent to another pharmacy due to her going out of town today. Please advise.  Agent: Please be advised that RX refills may take up to 3 business days. We ask that you follow-up with your pharmacy.

## 2019-09-10 NOTE — Patient Instructions (Addendum)
Blood work was ordered.    All other Health Maintenance issues reviewed.   All recommended immunizations and age-appropriate screenings are up-to-date or discussed.    Medications reviewed and updated.  Changes include :   none    Please followup in 6 months    Health Maintenance, Female Adopting a healthy lifestyle and getting preventive care are important in promoting health and wellness. Ask your health care provider about:  The right schedule for you to have regular tests and exams.  Things you can do on your own to prevent diseases and keep yourself healthy. What should I know about diet, weight, and exercise? Eat a healthy diet   Eat a diet that includes plenty of vegetables, fruits, low-fat dairy products, and lean protein.  Do not eat a lot of foods that are high in solid fats, added sugars, or sodium. Maintain a healthy weight Body mass index (BMI) is used to identify weight problems. It estimates body fat based on height and weight. Your health care provider can help determine your BMI and help you achieve or maintain a healthy weight. Get regular exercise Get regular exercise. This is one of the most important things you can do for your health. Most adults should:  Exercise for at least 150 minutes each week. The exercise should increase your heart rate and make you sweat (moderate-intensity exercise).  Do strengthening exercises at least twice a week. This is in addition to the moderate-intensity exercise.  Spend less time sitting. Even light physical activity can be beneficial. Watch cholesterol and blood lipids Have your blood tested for lipids and cholesterol at 36 years of age, then have this test every 5 years. Have your cholesterol levels checked more often if:  Your lipid or cholesterol levels are high.  You are older than 36 years of age.  You are at high risk for heart disease. What should I know about cancer screening? Depending on your health  history and family history, you may need to have cancer screening at various ages. This may include screening for:  Breast cancer.  Cervical cancer.  Colorectal cancer.  Skin cancer.  Lung cancer. What should I know about heart disease, diabetes, and high blood pressure? Blood pressure and heart disease  High blood pressure causes heart disease and increases the risk of stroke. This is more likely to develop in people who have high blood pressure readings, are of African descent, or are overweight.  Have your blood pressure checked: ? Every 3-5 years if you are 40-36 years of age. ? Every year if you are 19 years old or older. Diabetes Have regular diabetes screenings. This checks your fasting blood sugar level. Have the screening done:  Once every three years after age 36 if you are at a normal weight and have a low risk for diabetes.  More often and at a younger age if you are overweight or have a high risk for diabetes. What should I know about preventing infection? Hepatitis B If you have a higher risk for hepatitis B, you should be screened for this virus. Talk with your health care provider to find out if you are at risk for hepatitis B infection. Hepatitis C Testing is recommended for:  Everyone born from [birth year] through 1967.  Anyone with known risk factors for hepatitis C. Sexually transmitted infections (STIs)  Get screened for STIs, including gonorrhea and chlamydia, if: ? You are sexually active and are younger than 36 years of age. ? You are  older than 36 years of age and your health care provider tells you that you are at risk for this type of infection. ? Your sexual activity has changed since you were last screened, and you are at increased risk for chlamydia or gonorrhea. Ask your health care provider if you are at risk.  Ask your health care provider about whether you are at high risk for HIV. Your health care provider may recommend a prescription medicine to  help prevent HIV infection. If you choose to take medicine to prevent HIV, you should first get tested for HIV. You should then be tested every 3 months for as long as you are taking the medicine. Pregnancy  If you are about to stop having your period (premenopausal) and you may become pregnant, seek counseling before you get pregnant.  Take 400 to 800 micrograms (mcg) of folic acid every day if you become pregnant.  Ask for birth control (contraception) if you want to prevent pregnancy. Osteoporosis and menopause Osteoporosis is a disease in which the bones lose minerals and strength with aging. This can result in bone fractures. If you are 36 years old or older, or if you are at risk for osteoporosis and fractures, ask your health care provider if you should:  Be screened for bone loss.  Take a calcium or vitamin D supplement to lower your risk of fractures.  Be given hormone replacement therapy (HRT) to treat symptoms of menopause. Follow these instructions at home: Lifestyle  Do not use any products that contain nicotine or tobacco, such as cigarettes, e-cigarettes, and chewing tobacco. If you need help quitting, ask your health care provider.  Do not use street drugs.  Do not share needles.  Ask your health care provider for help if you need support or information about quitting drugs. Alcohol use  Do not drink alcohol if: ? Your health care provider tells you not to drink. ? You are pregnant, may be pregnant, or are planning to become pregnant.  If you drink alcohol: ? Limit how much you use to 0-1 drink a day. ? Limit intake if you are breastfeeding.  Be aware of how much alcohol is in your drink. In the [place] one drink equals one 12 oz bottle of beer (355 mL), one 5 oz glass of wine (148 mL), or one 1 oz glass of hard liquor (44 mL). General instructions  Schedule regular health, dental, and eye exams.  Stay current with your vaccines.  Tell your health care  provider if: ? You often feel depressed. ? You have ever been abused or do not feel safe at home. Summary  Adopting a healthy lifestyle and getting preventive care are important in promoting health and wellness.  Follow your health care provider's instructions about healthy diet, exercising, and getting tested or screened for diseases.  Follow your health care provider's instructions on monitoring your cholesterol and blood pressure. This information is not intended to replace advice given to you by your health care provider. Make sure you discuss any questions you have with your health care provider. Document Revised: 03/08/2018 Document Reviewed: 03/08/2018 Elsevier Patient Education  2020 [employer].

## 2019-09-10 NOTE — Progress Notes (Signed)
Subjective:    Patient ID: Adriana Hansen, female    DOB: 1985-06-17, 34 y.o.   MRN: [id number]  HPI She is here for a physical exam.   She has no concerns.   There have been no changes in her history.   Medications and allergies reviewed with patient and updated if appropriate.  Patient Active Problem List   Diagnosis Date Noted  . Onychomycosis 09/12/2017  . Contact dermatitis 08/29/2017  . Tobacco abuse 08/02/2017  . Toenail deformity 08/02/2017  . Obesity (BMI 30.0-34.9) 02/02/2017  . Numbness of fingers of both hands 02/07/2015  . Attention deficit disorder 09/01/2006    Current Outpatient Medications on File Prior to Visit  Medication Sig Dispense Refill  . amphetamine-dextroamphetamine (ADDERALL) 15 MG tablet Take 1 tablet by mouth 2 (two) times daily. 60 tablet 0  . letrozole (FEMARA) 2.5 MG tablet     . Multiple Vitamin (MULTIVITAMIN) tablet Take by mouth.     No current facility-administered medications on file prior to visit.    Past Medical History:  Diagnosis Date  . 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 History of ectopic pregnancy 09/2010   s/p  RSO  . History of gonorrhea 2009  . Uterus, septate     Past Surgical History:  Procedure Laterality Date  . HYSTEROSCOPY WITH D & C N/A 09/28/2018   Procedure: DILATATION AND CURETTAGE /HYSTEROSCOPY;  Surgeon: Janyth Contes, MD;  Location: [place];  Service: Gynecology;  Laterality: N/A;  . LAPAROSCOPY  10/13/2010   Procedure: LAPAROSCOPY OPERATIVE;  Surgeon: Thornell Sartorius, MD;  Location: [facility];  Service: Gynecology;  Laterality: N/A;  Lysis of Adhesions. Right Salpingooophrectomy,Evacuation of Hematoperitoneum  . LAPAROSCOPY N/A 09/28/2018   Procedure: LAPAROSCOPY DIAGNOSTIC;  Surgeon: Janyth Contes, MD;  Location: [place];  Service: Gynecology;  Laterality: N/A;  possible  . TONSILLECTOMY  child  . UTERINE  SEPTUM RESECTION N/A 09/28/2018   Procedure: RESECTION OF UTERINE SEPTUM;  Surgeon: Janyth Contes, MD;  Location: [place];  Service: Gynecology;  Laterality: N/A;  request RNFA or extra tech  . WISDOM TOOTH EXTRACTION      Social History   Socioeconomic History  . Marital status: Single    Spouse name: Not on file  . Number of children: Not on file  . Years of education: Not on file  . Highest education level: Not on file  Occupational History  . Not on file  Tobacco Use  . Smoking status: Current Every Day Smoker    Packs/day: 0.50    Years: 15.00    Pack years: 7.50    Types: Cigarettes  . Smokeless tobacco: Never Used  Vaping Use  . Vaping Use: Never used  Substance and Sexual Activity  . Alcohol use: Not Currently    Comment: rare  . Drug use: Never  . Sexual activity: Yes    Birth control/protection: None  Other Topics Concern  . Not on file  Social History Narrative   Acute, no regular exercise   Social Determinants of Health   Financial Resource Strain:   . Difficulty of Paying Living Expenses:   Food Insecurity:   . Worried About Charity fundraiser in the Last Year:   . Arboriculturist in the Last Year:   Transportation Needs:   . Film/video editor (Medical):   Marland Kitchen Lack of Transportation (Non-Medical):   Physical Activity:   .  Days of Exercise per Week:   . Minutes of Exercise per Session:   Stress:   . Feeling of Stress :   Social Connections:   . Frequency of Communication with Friends and Family:   . Frequency of Social Gatherings with Friends and Family:   . Attends Religious Services:   . Active Member of Clubs or Organizations:   . Attends Banker Meetings:   Marland Kitchen Marital Status:     Family History  Problem Relation Age of Onset  . Hyperlipidemia Father   . Hypertension Father   . Hyperlipidemia Brother   . Hyperlipidemia Maternal Grandfather   . Hypertension Maternal Grandfather   . Parkinson's  disease Maternal Grandfather   . Hyperlipidemia Mother   . Hyperlipidemia Maternal Grandmother   . Cancer Neg Hx   . Stroke Neg Hx   . Diabetes Neg Hx   . Heart disease Neg Hx     Review of Systems  Constitutional: Negative for chills and fever.  Eyes: Negative for visual disturbance.  Respiratory: Negative for cough, shortness of breath and wheezing.   Cardiovascular: Negative for chest pain, palpitations and leg swelling.  Gastrointestinal: Negative for abdominal pain, blood in stool, constipation, diarrhea and nausea.       No gerd  Genitourinary: Negative for dysuria and hematuria.  Musculoskeletal: Negative for arthralgias and back pain.  Skin: Negative for rash.  Neurological: Negative for dizziness, light-headedness and headaches.  Psychiatric/Behavioral: Negative for dysphoric mood. The patient is not nervous/anxious.        Objective:   Vitals:   09/11/19 1102  BP: 124/80  Pulse: 96  Temp: 98.1 F (36.7 C)  SpO2: 97%   Filed Weights   09/11/19 1102  Weight: 198 lb 6.4 oz (90 kg)   Body mass index is 33.02 kg/m.  BP Readings from Last 3 Encounters:  09/11/19 124/80  03/13/19 134/70  09/28/18 116/74    Wt Readings from Last 3 Encounters:  09/11/19 198 lb 6.4 oz (90 kg)  03/13/19 199 lb 12.8 oz (90.6 kg)  09/28/18 203 lb 8 oz (92.3 kg)     Physical Exam Constitutional: She appears well-developed and well-nourished. No distress.  HENT:  Head: Normocephalic and atraumatic.  Right Ear: External ear normal. Normal ear canal and TM Left Ear: External ear normal.  Normal ear canal and TM Mouth/Throat: Oropharynx is clear and moist.  Eyes: Conjunctivae and EOM are normal.  Neck: Neck supple. No tracheal deviation present. No thyromegaly present.  No carotid bruit  Cardiovascular: Normal rate, regular rhythm and normal heart sounds.   No murmur heard.  No edema. Pulmonary/Chest: Effort normal and breath sounds normal. No respiratory distress. She has no  wheezes. She has no rales.  Breast: deferred   Abdominal: Soft. She exhibits no distension. There is no tenderness.  Lymphadenopathy: She has no cervical adenopathy.  Skin: Skin is warm and dry. She is not diaphoretic.  Psychiatric: She has a normal mood and affect. Her behavior is normal.        Assessment & Plan:   Physical exam: Screening blood work    ordered Immunizations  tdap today- ? given, discussed covid Gyn  Up to date   Exercise  Walking a little Weight  encouraged weight loss Substance abuse  Working on quitting smoking  See Problem List for Assessment and Plan of chronic medical problems.   This visit occurred during the SARS-CoV-2 public health emergency.  Safety protocols were in place, including screening  questions prior to the visit, additional usage of staff PPE, and extensive cleaning of exam room while observing appropriate contact time as indicated for disinfecting solutions.

## 2019-09-11 ENCOUNTER — Encounter: Payer: Self-pay | Admitting: Internal Medicine

## 2019-09-11 ENCOUNTER — Other Ambulatory Visit: Payer: Self-pay

## 2019-09-11 ENCOUNTER — Ambulatory Visit (INDEPENDENT_AMBULATORY_CARE_PROVIDER_SITE_OTHER): Payer: BC Managed Care – PPO | Admitting: Internal Medicine

## 2019-09-11 VITALS — BP 124/80 | HR 96 | Temp 98.1°F | Ht 65.0 in | Wt 198.4 lb

## 2019-09-11 DIAGNOSIS — Z1159 Encounter for screening for other viral diseases: Secondary | ICD-10-CM

## 2019-09-11 DIAGNOSIS — Z0000 Encounter for general adult medical examination without abnormal findings: Secondary | ICD-10-CM | POA: Diagnosis not present

## 2019-09-11 DIAGNOSIS — F988 Other specified behavioral and emotional disorders with onset usually occurring in childhood and adolescence: Secondary | ICD-10-CM | POA: Diagnosis not present

## 2019-09-11 DIAGNOSIS — Z23 Encounter for immunization: Secondary | ICD-10-CM

## 2019-09-11 DIAGNOSIS — Z720 Tobacco use: Secondary | ICD-10-CM

## 2019-09-11 DIAGNOSIS — E669 Obesity, unspecified: Secondary | ICD-10-CM

## 2019-09-11 LAB — COMPREHENSIVE METABOLIC PANEL
ALT: 11 U/L (ref 0–35)
AST: 13 U/L (ref 0–37)
Albumin: 4.4 g/dL (ref 3.5–5.2)
Alkaline Phosphatase: 84 U/L (ref 39–117)
BUN: 9 mg/dL (ref 6–23)
CO2: 26 mEq/L (ref 19–32)
Calcium: 9.1 mg/dL (ref 8.4–10.5)
Chloride: 104 mEq/L (ref 96–112)
Creatinine, Ser: 0.65 mg/dL (ref 0.40–1.20)
GFR: 102.92 mL/min (ref >60.00)
Glucose, Bld: 97 mg/dL (ref 70–99)
Potassium: 4.2 mEq/L (ref 3.5–5.1)
Sodium: 136 mEq/L (ref 135–145)
Total Bilirubin: 0.7 mg/dL (ref 0.2–1.2)
Total Protein: 6.8 g/dL (ref 6.0–8.3)

## 2019-09-11 LAB — CBC WITH DIFFERENTIAL/PLATELET
Basophils Absolute: 0.1 10*3/uL (ref 0.0–0.1)
Basophils Relative: 0.5 % (ref 0.0–3.0)
Eosinophils Absolute: 0.1 10*3/uL (ref 0.0–0.7)
Eosinophils Relative: 0.7 % (ref 0.0–5.0)
HCT: 37.7 % (ref 36.0–46.0)
Hemoglobin: 12.7 g/dL (ref 12.0–15.0)
Lymphocytes Relative: 28.9 % (ref 12.0–46.0)
Lymphs Abs: 3.5 10*3/uL (ref 0.7–4.0)
MCHC: 33.7 g/dL (ref 30.0–36.0)
MCV: 87.7 fl (ref 78.0–100.0)
Monocytes Absolute: 0.9 10*3/uL (ref 0.1–1.0)
Monocytes Relative: 7.4 % (ref 3.0–12.0)
Neutro Abs: 7.5 10*3/uL (ref 1.4–7.7)
Neutrophils Relative %: 62.5 % (ref 43.0–77.0)
Platelets: 413 10*3/uL — ABNORMAL HIGH (ref 150.0–400.0)
RBC: 4.3 Mil/uL (ref 3.87–5.11)
RDW: 13.4 % (ref 11.5–15.5)
WBC: 11.9 10*3/uL — ABNORMAL HIGH (ref 4.0–10.5)

## 2019-09-11 LAB — LIPID PANEL
Cholesterol: 173 mg/dL (ref 0–200)
HDL: 36.8 mg/dL — ABNORMAL LOW (ref >39.00)
LDL Cholesterol: 122 mg/dL — ABNORMAL HIGH (ref 0–99)
NonHDL: 136.29
Total CHOL/HDL Ratio: 5
Triglycerides: 73 mg/dL (ref 0.0–149.0)
VLDL: 14.6 mg/dL (ref 0.0–40.0)

## 2019-09-11 LAB — TSH: TSH: 1.84 u[IU]/mL (ref 0.35–4.50)

## 2019-09-11 NOTE — Assessment & Plan Note (Signed)
Chronic Controlled, stable Continue current dose of medication Adderall 15 mg twice daily

## 2019-09-11 NOTE — Assessment & Plan Note (Signed)
Discussed importance of weight loss Stressed regular exercise and healthy diet

## 2019-09-11 NOTE — Addendum Note (Signed)
Addended by: Karma Ganja on: 09/11/2019 02:07 PM   Modules accepted: Orders

## 2019-09-11 NOTE — Assessment & Plan Note (Signed)
Chronic Her and her boyfriend are actively working on quitting - they are cutting down slowly Stressed smoking cessation

## 2019-09-12 LAB — HEPATITIS C ANTIBODY
Hepatitis C Ab: NONREACTIVE
SIGNAL TO CUT-OFF: 0.01 (ref <1.00)

## 2019-09-20 ENCOUNTER — Other Ambulatory Visit: Payer: Self-pay | Admitting: Internal Medicine

## 2019-09-20 DIAGNOSIS — F988 Other specified behavioral and emotional disorders with onset usually occurring in childhood and adolescence: Secondary | ICD-10-CM

## 2019-09-20 MED ORDER — AMPHETAMINE-DEXTROAMPHETAMINE 15 MG PO TABS: 15.0000 mg | ORAL_TABLET | Freq: Two times a day (BID) | ORAL | 0 refills | Status: DC

## 2019-09-24 ENCOUNTER — Telehealth: Payer: Self-pay | Admitting: Internal Medicine

## 2019-09-24 NOTE — Telephone Encounter (Signed)
1.Medication Requested: amphetamine-dextroamphetamine (ADDERALL) 15 MG tablet  2. Pharmacy (Name, Street, [facility]):  [id number] - [place], [place] - [address] AT [facility] [address] & [facility] [address] Phone:  [phone]  Fax:  [phone]       3. On Med List: Y  4. Last Visit with PCP: 12.15.2020  5. Next visit date with PCP: 03/12/2020   Agent: Please be advised that RX refills may take up to 3 business days. We ask that you follow-up with your pharmacy.  Patient requested refill on My Chart and it went to the [facility] instead of [facility], patient needs the [facility] cancelled and the refill sent here to [facility]

## 2019-09-24 NOTE — Telephone Encounter (Signed)
This was sent on 09/20/19.

## 2019-09-25 ENCOUNTER — Other Ambulatory Visit: Payer: Self-pay | Admitting: Internal Medicine

## 2019-09-25 DIAGNOSIS — F988 Other specified behavioral and emotional disorders with onset usually occurring in childhood and adolescence: Secondary | ICD-10-CM

## 2019-09-26 MED ORDER — AMPHETAMINE-DEXTROAMPHETAMINE 15 MG PO TABS: 15.0000 mg | ORAL_TABLET | Freq: Two times a day (BID) | ORAL | 0 refills | Status: DC

## 2019-10-21 ENCOUNTER — Other Ambulatory Visit: Payer: Self-pay | Admitting: Internal Medicine

## 2019-10-21 DIAGNOSIS — F988 Other specified behavioral and emotional disorders with onset usually occurring in childhood and adolescence: Secondary | ICD-10-CM

## 2019-10-22 MED ORDER — AMPHETAMINE-DEXTROAMPHETAMINE 15 MG PO TABS: 15.0000 mg | ORAL_TABLET | Freq: Two times a day (BID) | ORAL | 0 refills | Status: DC

## 2019-11-21 ENCOUNTER — Other Ambulatory Visit: Payer: Self-pay | Admitting: Internal Medicine

## 2019-11-21 DIAGNOSIS — F988 Other specified behavioral and emotional disorders with onset usually occurring in childhood and adolescence: Secondary | ICD-10-CM

## 2019-11-22 MED ORDER — AMPHETAMINE-DEXTROAMPHETAMINE 15 MG PO TABS: 15.0000 mg | ORAL_TABLET | Freq: Two times a day (BID) | ORAL | 0 refills | Status: DC

## 2019-12-05 ENCOUNTER — Other Ambulatory Visit: Payer: Self-pay | Admitting: Internal Medicine

## 2019-12-20 ENCOUNTER — Other Ambulatory Visit: Payer: Self-pay | Admitting: Internal Medicine

## 2019-12-20 DIAGNOSIS — F988 Other specified behavioral and emotional disorders with onset usually occurring in childhood and adolescence: Secondary | ICD-10-CM

## 2019-12-20 MED ORDER — AMPHETAMINE-DEXTROAMPHETAMINE 15 MG PO TABS: 15.0000 mg | ORAL_TABLET | Freq: Two times a day (BID) | ORAL | 0 refills | Status: DC

## 2020-01-18 ENCOUNTER — Other Ambulatory Visit: Payer: Self-pay | Admitting: Internal Medicine

## 2020-01-18 DIAGNOSIS — F988 Other specified behavioral and emotional disorders with onset usually occurring in childhood and adolescence: Secondary | ICD-10-CM

## 2020-01-18 MED ORDER — AMPHETAMINE-DEXTROAMPHETAMINE 15 MG PO TABS: 15.0000 mg | ORAL_TABLET | Freq: Two times a day (BID) | ORAL | 0 refills | Status: DC

## 2020-02-14 ENCOUNTER — Other Ambulatory Visit: Payer: Self-pay | Admitting: Internal Medicine

## 2020-02-14 DIAGNOSIS — F988 Other specified behavioral and emotional disorders with onset usually occurring in childhood and adolescence: Secondary | ICD-10-CM

## 2020-02-15 MED ORDER — AMPHETAMINE-DEXTROAMPHETAMINE 15 MG PO TABS: 15.0000 mg | ORAL_TABLET | Freq: Two times a day (BID) | ORAL | 0 refills | Status: DC

## 2020-03-12 NOTE — Progress Notes (Signed)
Subjective:    Patient ID: Adriana Hansen, female    DOB: Aug 01, 1985, 34 y.o.   MRN: [id number]  HPI The patient is here for follow up of their chronic medical problems, including ADD.   ADD:  She is taking her medication as prescribed.  She feels the medication is effective.  She denies side effects, including palpitations, headaches, lightheadedness, decreased appetite and weight loss.    Not pregnant.    Medications and allergies reviewed with patient and updated if appropriate.  Patient Active Problem List   Diagnosis Date Noted  . Onychomycosis 09/12/2017  . Tobacco abuse 08/02/2017  . Toenail deformity 08/02/2017  . Obesity (BMI 30.0-34.9) 02/02/2017  . Numbness of fingers of both hands 02/07/2015  . Attention deficit disorder 09/01/2006    Current Outpatient Medications on File Prior to Visit  Medication Sig Dispense Refill  . Multiple Vitamin (MULTIVITAMIN) tablet Take by mouth.     No current facility-administered medications on file prior to visit.    Past Medical History:  Diagnosis Date  . 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 History of ectopic pregnancy 09/2010   s/p  RSO  . History of gonorrhea 2009  . Uterus, septate     Past Surgical History:  Procedure Laterality Date  . HYSTEROSCOPY WITH D & C N/A 09/28/2018   Procedure: DILATATION AND CURETTAGE /HYSTEROSCOPY;  Surgeon: Sherian Rein, MD;  Location: [facility];  Service: Gynecology;  Laterality: N/A;  . LAPAROSCOPY  10/13/2010   Procedure: LAPAROSCOPY OPERATIVE;  Surgeon: Sherron Monday, MD;  Location: [facility];  Service: Gynecology;  Laterality: N/A;  Lysis of Adhesions. Right Salpingooophrectomy,Evacuation of Hematoperitoneum  . LAPAROSCOPY N/A 09/28/2018   Procedure: LAPAROSCOPY DIAGNOSTIC;  Surgeon: Sherian Rein, MD;  Location: [facility];  Service: Gynecology;  Laterality: N/A;  possible  . TONSILLECTOMY   child  . UTERINE SEPTUM RESECTION N/A 09/28/2018   Procedure: RESECTION OF UTERINE SEPTUM;  Surgeon: Sherian Rein, MD;  Location: [facility];  Service: Gynecology;  Laterality: N/A;  request RNFA or extra tech  . WISDOM TOOTH EXTRACTION      Social History   Socioeconomic History  . Marital status: Single    Spouse name: Not on file  . Number of children: Not on file  . Years of education: Not on file  . Highest education level: Not on file  Occupational History  . Not on file  Tobacco Use  . Smoking status: Current Every Day Smoker    Packs/day: 0.50    Years: 15.00    Pack years: 7.50    Types: Cigarettes  . Smokeless tobacco: Never Used  Vaping Use  . Vaping Use: Never used  Substance and Sexual Activity  . Alcohol use: Not Currently    Comment: rare  . Drug use: Never  . Sexual activity: Yes    Birth control/protection: None  Other Topics Concern  . Not on file  Social History Narrative   Acute, no regular exercise   Social Determinants of Health   Financial Resource Strain: Not on file  Food Insecurity: Not on file  Transportation Needs: Not on file  Physical Activity: Not on file  Stress: Not on file  Social Connections: Not on file    Family History  Problem Relation Age of Onset  . Hyperlipidemia Father   . Hypertension Father   . Hyperlipidemia Brother   . Hyperlipidemia Maternal Grandfather   .  Hypertension Maternal Grandfather   . Parkinson's disease Maternal Grandfather   . Hyperlipidemia Mother   . Hyperlipidemia Maternal Grandmother   . Cancer Neg Hx   . Stroke Neg Hx   . Diabetes Neg Hx   . Heart disease Neg Hx     Review of Systems Per HPI    Objective:   Vitals:   03/13/20 0849  BP: 128/72  Pulse: 88  Temp: 98.1 F (36.7 C)  SpO2: 98%   BP Readings from Last 3 Encounters:  03/13/20 128/72  09/11/19 124/80  03/13/19 134/70   Wt Readings from Last 3 Encounters:  03/13/20 204 lb (92.5 kg)  09/11/19  198 lb 6.4 oz (90 kg)  03/13/19 199 lb 12.8 oz (90.6 kg)   Body mass index is 33.95 kg/m.   Physical Exam    Constitutional: Appears well-developed and well-nourished. No distress.   Psychiatric: Normal mood and affect. Behavior is normal.      Assessment & Plan:    See Problem List for Assessment and Plan of chronic medical problems.    This visit occurred during the SARS-CoV-2 public health emergency.  Safety protocols were in place, including screening questions prior to the visit, additional usage of staff PPE, and extensive cleaning of exam room while observing appropriate contact time as indicated for disinfecting solutions.

## 2020-03-13 ENCOUNTER — Other Ambulatory Visit: Payer: Self-pay

## 2020-03-13 ENCOUNTER — Encounter: Payer: Self-pay | Admitting: Internal Medicine

## 2020-03-13 ENCOUNTER — Ambulatory Visit (INDEPENDENT_AMBULATORY_CARE_PROVIDER_SITE_OTHER): Payer: BC Managed Care – PPO | Admitting: Internal Medicine

## 2020-03-13 VITALS — BP 128/72 | HR 88 | Temp 98.1°F | Ht 65.0 in | Wt 204.0 lb

## 2020-03-13 DIAGNOSIS — F988 Other specified behavioral and emotional disorders with onset usually occurring in childhood and adolescence: Secondary | ICD-10-CM | POA: Diagnosis not present

## 2020-03-13 MED ORDER — AMPHETAMINE-DEXTROAMPHETAMINE 15 MG PO TABS: 15.0000 mg | ORAL_TABLET | Freq: Two times a day (BID) | ORAL | 0 refills | Status: DC

## 2020-03-13 NOTE — Patient Instructions (Signed)
  Medications changes include :   none  Your prescription(s) have been submitted to your pharmacy. Please take as directed and contact our office if you believe you are having problem(s) with the medication(s).    Please followup in 6 months   

## 2020-03-13 NOTE — Assessment & Plan Note (Signed)
Chronic Controlled, stable Continue Adderall 15 mg twice daily [place] controlled substance database checked.  Refilled today FU in 6 months

## 2020-04-15 ENCOUNTER — Other Ambulatory Visit: Payer: Self-pay | Admitting: Internal Medicine

## 2020-04-15 DIAGNOSIS — Z23 Encounter for immunization: Secondary | ICD-10-CM | POA: Diagnosis not present

## 2020-04-15 DIAGNOSIS — F988 Other specified behavioral and emotional disorders with onset usually occurring in childhood and adolescence: Secondary | ICD-10-CM

## 2020-04-15 DIAGNOSIS — Z3201 Encounter for pregnancy test, result positive: Secondary | ICD-10-CM | POA: Diagnosis not present

## 2020-04-15 DIAGNOSIS — N911 Secondary amenorrhea: Secondary | ICD-10-CM | POA: Diagnosis not present

## 2020-04-15 MED ORDER — AMPHETAMINE-DEXTROAMPHETAMINE 15 MG PO TABS: 15.0000 mg | ORAL_TABLET | Freq: Two times a day (BID) | ORAL | 0 refills | Status: DC

## 2020-04-21 DIAGNOSIS — O209 Hemorrhage in early pregnancy, unspecified: Secondary | ICD-10-CM | POA: Diagnosis not present

## 2020-04-22 DIAGNOSIS — Z3A01 Less than 8 weeks gestation of pregnancy: Secondary | ICD-10-CM | POA: Diagnosis not present

## 2020-04-22 DIAGNOSIS — O200 Threatened abortion: Secondary | ICD-10-CM | POA: Diagnosis not present

## 2020-04-23 ENCOUNTER — Other Ambulatory Visit: Payer: Self-pay

## 2020-04-23 ENCOUNTER — Inpatient Hospital Stay (HOSPITAL_COMMUNITY): Payer: BC Managed Care – PPO

## 2020-04-23 ENCOUNTER — Inpatient Hospital Stay (HOSPITAL_COMMUNITY)
Admission: AD | Admit: 2020-04-23 | Discharge: 2020-04-23 | Disposition: A | Discharge status: Home / Self Care | Payer: BC Managed Care – PPO | Attending: Obstetrics and Gynecology | Admitting: Obstetrics and Gynecology

## 2020-04-23 ENCOUNTER — Encounter (HOSPITAL_COMMUNITY): Payer: Self-pay | Admitting: Obstetrics and Gynecology

## 2020-04-23 DIAGNOSIS — O26899 Other specified pregnancy related conditions, unspecified trimester: Secondary | ICD-10-CM

## 2020-04-23 DIAGNOSIS — O09519 Supervision of elderly primigravida, unspecified trimester: Secondary | ICD-10-CM | POA: Diagnosis not present

## 2020-04-23 DIAGNOSIS — Z3A00 Weeks of gestation of pregnancy not specified: Secondary | ICD-10-CM

## 2020-04-23 DIAGNOSIS — F17210 Nicotine dependence, cigarettes, uncomplicated: Secondary | ICD-10-CM | POA: Insufficient documentation

## 2020-04-23 DIAGNOSIS — O99331 Smoking (tobacco) complicating pregnancy, first trimester: Secondary | ICD-10-CM | POA: Diagnosis not present

## 2020-04-23 DIAGNOSIS — O26891 Other specified pregnancy related conditions, first trimester: Secondary | ICD-10-CM

## 2020-04-23 DIAGNOSIS — Z3201 Encounter for pregnancy test, result positive: Secondary | ICD-10-CM | POA: Diagnosis not present

## 2020-04-23 DIAGNOSIS — O209 Hemorrhage in early pregnancy, unspecified: Secondary | ICD-10-CM

## 2020-04-23 DIAGNOSIS — R109 Unspecified abdominal pain: Secondary | ICD-10-CM | POA: Diagnosis not present

## 2020-04-23 DIAGNOSIS — Z6790 Unspecified blood type, Rh positive: Secondary | ICD-10-CM

## 2020-04-23 DIAGNOSIS — Z3A01 Less than 8 weeks gestation of pregnancy: Secondary | ICD-10-CM | POA: Insufficient documentation

## 2020-04-23 DIAGNOSIS — O4690 Antepartum hemorrhage, unspecified, unspecified trimester: Secondary | ICD-10-CM

## 2020-04-23 DIAGNOSIS — O00102 Left tubal pregnancy without intrauterine pregnancy: Secondary | ICD-10-CM | POA: Diagnosis not present

## 2020-04-23 LAB — CBC
HCT: 39.9 % (ref 36.0–46.0)
Hemoglobin: 13 g/dL (ref 12.0–15.0)
MCH: 28.7 pg (ref 26.0–34.0)
MCHC: 32.6 g/dL (ref 30.0–36.0)
MCV: 88.1 fL (ref 80.0–100.0)
Platelets: 404 10*3/uL — ABNORMAL HIGH (ref 150–400)
RBC: 4.53 MIL/uL (ref 3.87–5.11)
RDW: 13.2 % (ref 11.5–15.5)
WBC: 14.2 10*3/uL — ABNORMAL HIGH (ref 4.0–10.5)
nRBC: 0 % (ref 0.0–0.2)

## 2020-04-23 LAB — COMPREHENSIVE METABOLIC PANEL
ALT: 16 U/L (ref 0–44)
AST: 17 U/L (ref 15–41)
Albumin: 3.8 g/dL (ref 3.5–5.0)
Alkaline Phosphatase: 84 U/L (ref 38–126)
Anion gap: 8 (ref 5–15)
BUN: 9 mg/dL (ref 6–20)
CO2: 25 mmol/L (ref 22–32)
Calcium: 8.9 mg/dL (ref 8.9–10.3)
Chloride: 103 mmol/L (ref 98–111)
Creatinine, Ser: 0.7 mg/dL (ref 0.44–1.00)
GFR, Estimated: >60 mL/min (ref >60)
Glucose, Bld: 103 mg/dL — ABNORMAL HIGH (ref 70–99)
Potassium: 4.6 mmol/L (ref 3.5–5.1)
Sodium: 136 mmol/L (ref 135–145)
Total Bilirubin: 0.3 mg/dL (ref 0.3–1.2)
Total Protein: 6.5 g/dL (ref 6.5–8.1)

## 2020-04-23 LAB — HCG, QUANTITATIVE, PREGNANCY: hCG, Beta Chain, Quant, S: 7650 m[IU]/mL — ABNORMAL HIGH (ref <5)

## 2020-04-23 LAB — POCT PREGNANCY, URINE: Preg Test, Ur: POSITIVE — AB

## 2020-04-23 MED ORDER — METHOTREXATE SODIUM CHEMO INJECTION 250 MG/10ML
50.0000 mg/m2 | Freq: Once | INTRAMUSCULAR | Status: AC
  Administered 2020-04-23: 105 mg via INTRAMUSCULAR
  Filled 2020-04-23: qty 4.2

## 2020-04-23 MED ORDER — METHOTREXATE FOR ECTOPIC PREGNANCY
50.0000 mg/m2 | Freq: Once | INTRAMUSCULAR | Status: DC
Start: 2020-04-23 — End: 2020-04-23

## 2020-04-23 NOTE — MAU Provider Note (Signed)
Chief Complaint: Follow-up   None     SUBJECTIVE HPI: Adriana Hansen is a 35 y.o. G2P0 with hx significant for ectopic pregnancy with right salpingectomy in 2012 presents at 70w5dby LMP with inappropriate rise in hcg [facility]  She was sent to maternity admissions for treatment for ectopic pregnancy.  She reports bleeding x 4-5 days that is light and dark red.  There are no other symptoms. She denies any pain. She has not tried any treatments.    HPI  Past Medical History:  Diagnosis Date  . 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 History of ectopic pregnancy 09/2010   s/p  RSO  . History of gonorrhea 2009  . Uterus, septate    Past Surgical History:  Procedure Laterality Date  . HYSTEROSCOPY WITH D & C N/A 09/28/2018   Procedure: DILATATION AND CURETTAGE /HYSTEROSCOPY;  Surgeon: BJanyth Contes MD;  [facility] [facility]  Service: Gynecology;  Laterality: N/A;  . LAPAROSCOPY  10/13/2010   Procedure: LAPAROSCOPY OPERATIVE;  Surgeon: JThornell Sartorius MD;  [facility];  Service: Gynecology;  Laterality: N/A;  Lysis of Adhesions. Right Salpingooophrectomy,Evacuation of Hematoperitoneum  . LAPAROSCOPY N/A 09/28/2018   Procedure: LAPAROSCOPY DIAGNOSTIC;  Surgeon: BJanyth Contes MD;  [place] [facility]  Service: Gynecology;  Laterality: N/A;  possible  . TONSILLECTOMY  child  . UTERINE SEPTUM RESECTION N/A 09/28/2018   Procedure: RESECTION OF UTERINE SEPTUM;  Surgeon: BJanyth Contes MD;  [place] [facility]  Service: Gynecology;  Laterality: N/A;  request RNFA or extra tech  . WISDOM TOOTH EXTRACTION     Social History   Socioeconomic History  . Marital status: Single    Spouse name: Not on file  . Number of children: Not on file  . Years of education: Not on file  . Highest education level: Not on file  Occupational  History  . Not on file  Tobacco Use  . Smoking status: Current Every Day Smoker    Packs/day: 0.50    Years: 15.00    Pack years: 7.50    Types: Cigarettes  . Smokeless tobacco: Never Used  Vaping Use  . Vaping Use: Never used  Substance and Sexual Activity  . Alcohol use: Not Currently    Comment: rare  . Drug use: Never  . Sexual activity: Yes    Birth control/protection: None  Other Topics Concern  . Not on file  Social History Narrative   Acute, no regular exercise   Social Determinants of Health   Financial Resource Strain: Not on file  Food Insecurity: Not on file  Transportation Needs: Not on file  Physical Activity: Not on file  Stress: Not on file  Social Connections: Not on file  Intimate Partner Violence: Not on file   No current facility-administered medications on file prior to encounter.   Current Outpatient Medications on File Prior to Encounter  Medication Sig Dispense Refill  . amphetamine-dextroamphetamine (ADDERALL) 15 MG tablet Take 1 tablet by mouth 2 (two) times daily. 60 tablet 0  . Multiple Vitamin (MULTIVITAMIN) tablet Take by mouth.     No Known Allergies  ROS:  Review of Systems  Constitutional: Negative for chills, fatigue and fever.  Eyes: Negative for visual disturbance.  Respiratory: Negative for shortness of breath.   Cardiovascular: Negative for chest pain.  Gastrointestinal: Negative for abdominal pain, nausea and vomiting.  Genitourinary: Positive for vaginal bleeding. Negative for difficulty urinating, dysuria, flank pain, pelvic pain, vaginal discharge and vaginal pain.  Neurological: Negative for dizziness and headaches.  Psychiatric/Behavioral: Negative.      I have reviewed patient's Past Medical Hx, Surgical Hx, Family Hx, Social Hx, medications and allergies.   Physical Exam   Patient Vitals for the past 24 hrs:  BP Temp Temp src Pulse Resp Weight  04/23/20 1717 -- -- -- -- -- 94 kg  04/23/20 1711 (!) 152/81 98.4  F (36.9 C) Oral 89 19 --   Constitutional: Well-developed, well-nourished female in no acute distress.  Cardiovascular: normal rate Respiratory: normal effort GI: Abd soft, non-tender. Pos BS x 4 MS: Extremities nontender, no edema, normal ROM Neurologic: Alert and oriented x 4.  GU: Neg CVAT.  PELVIC EXAM: Deferred   LAB RESULTS Results for orders placed or performed during the hospital encounter of 04/23/20 (from the past 24 hour(s))  Pregnancy, urine POC     Status: Abnormal   Collection Time: 04/23/20  4:22 PM  Result Value Ref Range   Preg Test, Ur POSITIVE (A) NEGATIVE  CBC     Status: Abnormal   Collection Time: 04/23/20  4:39 PM  Result Value Ref Range   WBC 14.2 (H) 4.0 - 10.5 K/uL   RBC 4.53 3.87 - 5.11 MIL/uL   Hemoglobin 13.0 12.0 - 15.0 g/dL   HCT 39.9 36.0 - 46.0 %   MCV 88.1 80.0 - 100.0 fL   MCH 28.7 26.0 - 34.0 pg   MCHC 32.6 30.0 - 36.0 g/dL   RDW 13.2 11.5 - 15.5 %   Platelets 404 (H) 150 - 400 K/uL   nRBC 0.0 0.0 - 0.2 %  Comprehensive metabolic panel     Status: Abnormal   Collection Time: 04/23/20  4:39 PM  Result Value Ref Range   Sodium 136 135 - 145 mmol/L   Potassium 4.6 3.5 - 5.1 mmol/L   Chloride 103 98 - 111 mmol/L   CO2 25 22 - 32 mmol/L   Glucose, Bld 103 (H) 70 - 99 mg/dL   BUN 9 6 - 20 mg/dL   Creatinine, Ser 0.70 0.44 - 1.00 mg/dL   Calcium 8.9 8.9 - 10.3 mg/dL   Total Protein 6.5 6.5 - 8.1 g/dL   Albumin 3.8 3.5 - 5.0 g/dL   AST 17 15 - 41 U/L   ALT 16 0 - 44 U/L   Alkaline Phosphatase 84 38 - 126 U/L   Total Bilirubin 0.3 0.3 - 1.2 mg/dL   GFR, Estimated >60 >60 mL/min   Anion gap 8 5 - 15  hCG, quantitative, pregnancy     Status: Abnormal   Collection Time: 04/23/20  4:39 PM  Result Value Ref Range   hCG, Beta Chain, Quant, S 7,650 (H) <5 mIU/mL       IMAGING US OB LESS THAN 14 WEEKS WITH OB TRANSVAGINAL  Result Date: 04/23/2020 CLINICAL DATA:  Vaginal bleeding EXAM: OBSTETRIC <14 WK [place] AND TRANSVAGINAL OB US  TECHNIQUE: Both transabdominal and transvaginal ultrasound examinations were performed for complete evaluation of the gestation as well as the maternal uterus, adnexal regions, and pelvic cul-de-sac. Transvaginal technique was performed to assess early pregnancy. COMPARISON:  None. FINDINGS: Intrauterine gestational sac: None Yolk sac:  Not Visualized. Embryo:  Not Visualized. Cardiac Activity: Not Visualized. Heart Rate:   bpm MSD:   mm    w     d CRL:    mm  w    d                  [place] EDC: Subchorionic hemorrhage:  None visualized. Maternal uterus/adnexae: Surgically absent right ovary. Left ovary not visualized. No visible adnexal mass. IMPRESSION: No intrauterine gestation. No visible adnexal mass. Recommend continued follow-up with serial quantitative beta HCGs and ultrasounds as indicated. Electronically Signed   By: Rolm Baptise M.D.   On: 04/23/2020 18:43    MAU Management/MDM: Orders Placed This Encounter  Procedures  . US OB LESS THAN 14 WEEKS WITH OB TRANSVAGINAL  . CBC  . Comprehensive metabolic panel  . hCG, quantitative, pregnancy  . Pregnancy, urine POC  . Discharge patient    Meds ordered this encounter  Medications  . DISCONTD: methotrexate (EMGYN) chemo injection kit 105 mg  . methotrexate chemo injection 105 mg    Hcg in MAU is 7650 and [place] does not see IUP or adnexal mass.  Consult Dr Roselie Awkward with assessment and findings.  Given no evidence of IUP and past hx of ruptured ectopic, recommend reviewing new information with her providers at [facility].  Spoke with Dr Wilhelmenia Blase, who wants to proceed with methotrexate for likely ectopic pregnancy.  Methotrexate ordered and given. Pt tolerated well.  Day 4 labs in MAU on Saturday, 04/26/20, then Day 7 labs in the office next week.  Ectopic precautions/reasons to return to MAU reviewed with pt.    ASSESSMENT 1. Left tubal pregnancy without intrauterine pregnancy   2. Abdominal pain during pregnancy in first trimester   3. Rh(D)  positive   4. Vaginal bleeding in pregnancy, first trimester     PLAN Discharge home Allergies as of 04/23/2020   No Known Allergies     Medication List    STOP taking these medications   multivitamin tablet     TAKE these medications   amphetamine-dextroamphetamine 15 MG tablet Commonly known as: ADDERALL Take 1 tablet by mouth 2 (two) times daily.        Fatima Blank Certified Nurse-Midwife 04/23/2020  8:07 PM

## 2020-04-23 NOTE — MAU Note (Signed)
Found out she was pregnant and had an U/S yesterday that was suspicious for ectopic pregnancy.  Hx of ectopic in 2012.  Had started bleeding on Monday, still continuing on.  Denies any abdominal cramping.

## 2020-04-23 NOTE — Progress Notes (Signed)
Written and verbal d/c instructions given and understanding voiced. Will wait for after injection before going home. To return Sat  for repeat BHCG lab or sooner for any concerns

## 2020-04-26 ENCOUNTER — Inpatient Hospital Stay (HOSPITAL_COMMUNITY)
Admission: AD | Admit: 2020-04-26 | Discharge: 2020-04-26 | Disposition: A | Discharge status: Home / Self Care | Payer: BC Managed Care – PPO | Attending: Obstetrics and Gynecology | Admitting: Obstetrics and Gynecology

## 2020-04-26 DIAGNOSIS — O00102 Left tubal pregnancy without intrauterine pregnancy: Secondary | ICD-10-CM

## 2020-04-26 DIAGNOSIS — Z3A08 8 weeks gestation of pregnancy: Secondary | ICD-10-CM | POA: Diagnosis not present

## 2020-04-26 LAB — HCG, QUANTITATIVE, PREGNANCY: hCG, Beta Chain, Quant, S: 7155 m[IU]/mL — ABNORMAL HIGH (ref <5)

## 2020-04-26 NOTE — Discharge Instructions (Signed)
 Methotrexate injection What is this medicine? METHOTREXATE (METH oh TREX ate) is a chemotherapy medicine. It treats certain types of cancer. Some of the cancers treated are breast cancer, head and neck cancer, leukemia, lymphoma, and osteosarcoma. This medicine can also be used to treat psoriasis and certain kinds of arthritis. This medicine may be used for other purposes; ask your health care provider or pharmacist if you have questions. What should I tell my health care provider before I take this medicine? They need to know if you have any of these conditions:  fluid in the stomach area or lungs  if you often drink alcohol  infection or immune system problems  kidney disease  liver disease  low blood counts (white cells, platelets, or red blood cells)  lung disease  recent or ongoing radiation  recent or upcoming vaccine  stomach ulcers  ulcerative colitis  an unusual or allergic reaction to methotrexate, other medicines, foods, dyes, or preservatives  pregnant or trying to get pregnant  breast-feeding How should I use this medicine? This medicine is for infusion into a vein or for injection into muscle or into the spinal fluid (whichever applies). It is usually given by a health care professional in a hospital or clinic setting. In rare cases, you might get this medicine at home. You will be taught how to give this medicine. Use exactly as directed. Take your medicine at regular intervals. Do not take your medicine more often than directed. If this medicine is used for arthritis or psoriasis, it should be taken weekly, NOT daily. It is important that you put your used needles and syringes in a special sharps container. Do not put them in a trash can. If you do not have a sharps container, call your pharmacist or healthcare provider to get one. Talk to your pediatrician regarding the use of this medicine in children. While this drug may be prescribed for children as  young as 2 years for selected conditions, precautions do apply. Overdosage: If you think you have taken too much of this medicine contact a poison control center or emergency room at once. NOTE: This medicine is only for you. Do not share this medicine with others. What if I miss a dose? It is important not to miss your dose. Call your doctor or health care professional if you are unable to keep an appointment. If you give yourself the medicine and you miss a dose, talk with your doctor or health care professional. Do not take double or extra doses. What may interact with this medicine? Do not take this medicine with any of the following medications:  acitretin This medicine may also interact with the following medications:  aspirin or aspirin-like medicines including salicylates  azathioprine  certain antibiotics like chloramphenicol, penicillin, tetracycline  certain medicines that treat or prevent blood clots like warfarin, apixaban, dabigatran, and rivaroxaban  certain medicines for stomach problems like esomeprazole, omeprazole, pantoprazole  cyclosporine  dapsone  diuretics  folic acid  gold  hydroxychloroquine  live virus vaccines  medicines for infection like acyclovir, adefovir, amphotericin B, bacitracin, cidofovir, foscarnet, ganciclovir, gentamicin, pentamidine, vancomycin  mercaptopurine  NSAIDs, medicines for pain and inflammation, like ibuprofen or naproxen  pamidronate  pemetrexed  penicillamine  phenylbutazone  phenytoin  probenacid  pyrimethamine  retinoids such as isotretinoin and tretinoin  steroid medicines like prednisone or cortisone  sulfonamides like sulfasalazine and trimethoprim/sulfamethoxazole  theophylline  zoledronic acid This list may not describe all possible interactions. Give your health care provider   a list of all the medicines, herbs, non-prescription drugs, or dietary supplements you use. Also tell them if you  smoke, drink alcohol, or use illegal drugs. Some items may interact with your medicine. What should I watch for while using this medicine? This medicine may make you feel generally unwell. This is not uncommon as chemotherapy can affect healthy cells as well as cancer cells. Report any side effects. Continue your course of treatment even though you feel ill unless your health care provider tells you to stop. Your condition will be monitored carefully while you are receiving this medicine. Avoid alcoholic drinks. This medicine can cause serious side effects. To reduce the risk, your health care provider may give you other medicines to take before receiving this one. Be sure to follow the directions from your health care provider. This medicine can make you more sensitive to the sun. Keep out of the sun. If you cannot avoid being in the sun, wear protective clothing and use sunscreen. Do not use sun lamps or tanning beds/booths. You may get drowsy or dizzy. Do not drive, use machinery, or do anything that needs mental alertness until you know how this medicine affects you. Do not stand or sit up quickly, especially if you are an older patient. This reduces the risk of dizzy or fainting spells. You may need blood work while you are taking this medicine. Call your doctor or health care professional for advice if you get a fever, chills or sore throat, or other symptoms of a cold or flu. Do not treat yourself. This drug decreases your body's ability to fight infections. Try to avoid being around people who are sick. This medicine may increase your risk to bruise or bleed. Call your doctor or health care professional if you notice any unusual bleeding. Be careful brushing or flossing your teeth or using a toothpick because you may get an infection or bleed more easily. If you have any dental work done, tell your dentist you are receiving this medicine Check with your doctor or health care professional if you  get an attack of severe diarrhea, nausea and vomiting, or if you sweat a lot. The loss of too much body fluid can make it dangerous for you to take this medicine. Talk to your doctor about your risk of cancer. You may be more at risk for certain types of cancers if you take this medicine. Do not become pregnant while taking this medicine or for 6 months after stopping it. Women should inform their health care provider if they wish to become pregnant or think they might be pregnant. Men should not father a child while taking this medicine and for 3 months after stopping it. There is potential for serious harm to an unborn child. Talk to your health care provider for more information. Do not breast-feed an infant while taking this medicine or for 1 week after stopping it. This medicine may make it more difficult to get pregnant or father a child. Talk to your health care provider if you are concerned about your fertility. What side effects may I notice from receiving this medicine? Side effects that you should report to your doctor or health care professional as soon as possible:  allergic reactions like skin rash, itching or hives, swelling of the face, lips, or tongue  back pain  breathing problems or shortness of breath  confusion  diarrhea  dry, nonproductive cough  low blood counts - this medicine may decrease the number of white   blood cells, red blood cells and platelets. You may be at increased risk of infections and bleeding  mouth sores  redness, blistering, peeling or loosening of the skin, including inside the mouth  seizures  severe headaches  signs of infection - fever or chills, cough, sore throat, pain or difficulty passing urine  signs and symptoms of bleeding such as bloody or black, tarry stools; red or dark-brown urine; spitting up blood or brown material that looks like coffee grounds; red spots on the skin; unusual bruising or bleeding from the eye, gums, or  nose  signs and symptoms of kidney injury like trouble passing urine or change in the amount of urine  signs and symptoms of liver injury like dark yellow or brown urine; general ill feeling or flu-like symptoms; light-colored stools; loss of appetite; nausea; right upper belly pain; unusually weak or tired; yellowing of the eyes or skin  stiff neck  vomiting Side effects that usually do not require medical attention (report to your doctor or health care professional if they continue or are bothersome):  dizziness  hair loss  headache  stomach pain  upset stomach This list may not describe all possible side effects. Call your doctor for medical advice about side effects. You may report side effects to FDA at 1-800-FDA-1088. Where should I keep my medicine? This medicine is given in a hospital or clinic. It will not be stored at home. NOTE: This sheet is a summary. It may not cover all possible information. If you have questions about this medicine, talk to your doctor, pharmacist, or health care provider.  2021 Elsevier/Gold Standard (2019-07-31 10:19:36)  

## 2020-04-26 NOTE — MAU Note (Signed)
.   Adriana Hansen is a 35 y.o. at [redacted]w[redacted]d here in MAU reporting: here for repeat quant after MTX on Thursday. Reports some vaginal spotting but not soaking any pads. Has had cramps but is not having any pain at the moment.   Vitals:   04/26/20 1146  BP: 129/78  Pulse: 83  Resp: 20  Temp: 98.4 F (36.9 C)  SpO2: 100%

## 2020-04-26 NOTE — MAU Provider Note (Signed)
History   Chief Complaint:  Labs Only  Adriana Hansen is  35 y.o. G2P0 No LMP recorded. Patient is pregnant.. Patient is here for follow up of quantitative HCG and ongoing surveillance of pregnancy status. She is [redacted]w[redacted]d weeks gestation  by LMP.    Since her last visit, the patient is without new complaint. The patient reports bleeding as  spotting.  She denies any pain.  Her previous Quantitative HCG values are:  1/26: 7650  HCGs done in office unavailable at this time but previously showed abnormal rise in HCG prompting MTX administration on 1/26 Physical Exam   Blood pressure 129/78, pulse 83, temperature 98.4 F (36.9 C), temperature source Oral, resp. rate 20, height 5\' 5"  (1.651 m), weight 94.2 kg, SpO2 100 %.  Focused Gynecological Exam: examination not indicated  Labs: Results for orders placed or performed during the hospital encounter of 04/26/20 (from the past 24 hour(s))  hCG, quantitative, pregnancy   Collection Time: 04/26/20 11:47 AM  Result Value Ref Range   hCG, Beta Chain, Quant, S 7,155 (H) <5 mIU/mL    Assessment:   1. Left tubal pregnancy without intrauterine pregnancy   2. [redacted] weeks gestation of pregnancy     Reviewed results with Dr. 04/28/20- will bring patient back for Day 7 labs on Tuesday.  Plan: -Discharge home in stable condition -Strict ectopic precautions discussed -Patient advised to follow-up with MAU on 2/1 for repeat HCG. Discussed possible need for repeat MTX on that day. Patient and support person verbalized understanding.  -Patient may return to MAU as needed or if her condition were to change or worsen  Friday, CNM 04/26/2020, 4:04 PM

## 2020-04-29 ENCOUNTER — Other Ambulatory Visit: Payer: Self-pay

## 2020-04-29 ENCOUNTER — Inpatient Hospital Stay (HOSPITAL_COMMUNITY)
Admission: AD | Admit: 2020-04-29 | Discharge: 2020-04-29 | Disposition: A | Discharge status: Home / Self Care | Payer: BC Managed Care – PPO | Attending: Obstetrics and Gynecology | Admitting: Obstetrics and Gynecology

## 2020-04-29 DIAGNOSIS — Z3A08 8 weeks gestation of pregnancy: Secondary | ICD-10-CM | POA: Insufficient documentation

## 2020-04-29 DIAGNOSIS — O009 Unspecified ectopic pregnancy without intrauterine pregnancy: Secondary | ICD-10-CM | POA: Diagnosis not present

## 2020-04-29 LAB — COMPREHENSIVE METABOLIC PANEL
ALT: 18 U/L (ref 0–44)
AST: 17 U/L (ref 15–41)
Albumin: 3.9 g/dL (ref 3.5–5.0)
Alkaline Phosphatase: 78 U/L (ref 38–126)
Anion gap: 11 (ref 5–15)
BUN: 10 mg/dL (ref 6–20)
CO2: 22 mmol/L (ref 22–32)
Calcium: 8.8 mg/dL — ABNORMAL LOW (ref 8.9–10.3)
Chloride: 106 mmol/L (ref 98–111)
Creatinine, Ser: 0.68 mg/dL (ref 0.44–1.00)
GFR, Estimated: >60 mL/min (ref >60)
Glucose, Bld: 97 mg/dL (ref 70–99)
Potassium: 4.6 mmol/L (ref 3.5–5.1)
Sodium: 139 mmol/L (ref 135–145)
Total Bilirubin: 0.6 mg/dL (ref 0.3–1.2)
Total Protein: 6.6 g/dL (ref 6.5–8.1)

## 2020-04-29 LAB — HCG, QUANTITATIVE, PREGNANCY: hCG, Beta Chain, Quant, S: 5437 m[IU]/mL — ABNORMAL HIGH (ref <5)

## 2020-04-29 LAB — CBC
HCT: 39.4 % (ref 36.0–46.0)
Hemoglobin: 13 g/dL (ref 12.0–15.0)
MCH: 29.3 pg (ref 26.0–34.0)
MCHC: 33 g/dL (ref 30.0–36.0)
MCV: 88.7 fL (ref 80.0–100.0)
Platelets: 406 10*3/uL — ABNORMAL HIGH (ref 150–400)
RBC: 4.44 MIL/uL (ref 3.87–5.11)
RDW: 13.2 % (ref 11.5–15.5)
WBC: 12.6 10*3/uL — ABNORMAL HIGH (ref 4.0–10.5)
nRBC: 0 % (ref 0.0–0.2)

## 2020-04-29 NOTE — MAU Provider Note (Signed)
Ms. RILLA BUCKMAN  is a 35 y.o. G2P0 at [redacted]w[redacted]d who presents to MAU today for follow-up on day 7 after MTX for presumed ectopic pregnancy. The patient was seen in MAU on 04/23/20 and had quant hCG of 7650 and US showed no IUP. She received MTX that day and her day 4 qhcg was 7155. She endorses mild cramping pain and vaginal bleeding with small clots. No fever today.   OB History  Gravida Para Term Preterm AB Living  2            SAB IAB Ectopic Multiple Live Births               # Outcome Date GA Lbr Len/2nd Weight Sex Delivery Anes PTL Lv  2 Current           1 Gravida              Birth Comments: System Generated. Please review and update pregnancy details.    Past Medical History:  Diagnosis Date  . 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 History of ectopic pregnancy 09/2010   s/p  RSO  . History of gonorrhea 2009  . Uterus, septate     ROS: + VB + pain  BP 138/87 (BP Location: Right Arm)   Pulse 98   Temp 98.5 F (36.9 C) (Oral)   Resp 16   Ht 5\' 5"  (1.651 m)   Wt 93.7 kg   SpO2 100% Comment: room air  BMI 34.36 kg/m   CONSTITUTIONAL: Well-developed, well-nourished female in no acute distress.  MUSCULOSKELETAL: Normal range of motion.  CARDIOVASCULAR: Regular heart rate RESPIRATORY: Normal effort NEUROLOGICAL: Alert and oriented to person, place, and time.  SKIN: Not diaphoretic. No erythema. No pallor. PSYCH: Normal mood and affect. Normal behavior. Normal judgment and thought content.  Results for orders placed or performed during the hospital encounter of 04/29/20 (from the past 24 hour(s))  hCG, quantitative, pregnancy     Status: Abnormal   Collection Time: 04/29/20  9:27 AM  Result Value Ref Range   hCG, Beta Chain, Quant, S 5,437 (H) <5 mIU/mL  CBC     Status: Abnormal   Collection Time: 04/29/20  9:27 AM  Result Value Ref Range   WBC 12.6 (H) 4.0 - 10.5 K/uL   RBC 4.44 3.87 - 5.11 MIL/uL   Hemoglobin 13.0  12.0 - 15.0 g/dL   HCT 06/27/20 87.6 - 81.1 %   MCV 88.7 80.0 - 100.0 fL   MCH 29.3 26.0 - 34.0 pg   MCHC 33.0 30.0 - 36.0 g/dL   RDW 57.2 62.0 - 35.5 %   Platelets 406 (H) 150 - 400 K/uL   nRBC 0.0 0.0 - 0.2 %  Comprehensive metabolic panel     Status: Abnormal   Collection Time: 04/29/20  9:27 AM  Result Value Ref Range   Sodium 139 135 - 145 mmol/L   Potassium 4.6 3.5 - 5.1 mmol/L   Chloride 106 98 - 111 mmol/L   CO2 22 22 - 32 mmol/L   Glucose, Bld 97 70 - 99 mg/dL   BUN 10 6 - 20 mg/dL   Creatinine, Ser 06/27/20 0.44 - 1.00 mg/dL   Calcium 8.8 (L) 8.9 - 10.3 mg/dL   Total Protein 6.6 6.5 - 8.1 g/dL   Albumin 3.9 3.5 - 5.0 g/dL   AST 17 15 - 41 U/L   ALT 18 0 - 44 U/L  Alkaline Phosphatase 78 38 - 126 U/L   Total Bilirubin 0.6 0.3 - 1.2 mg/dL   GFR, Estimated >27 >78 mL/min   Anion gap 11 5 - 15    MDM: Labs ordered and reviewed. Good drop in quant, >15%. Consult with Dr. Jolayne Panther, plan for weekly outpatient management. Stable for discharge home.   A: 1. Ectopic pregnancy without intrauterine pregnancy, unspecified location     P: Discharge home Strict return precautions discussed Patient will return for follow-up quant HCG at [facility] next week- message left on Amanda's voicemail Patient may return to MAU as needed or if her condition were to change or worsen   Donette Larry, [place] 04/29/2020 12:20 PM

## 2020-04-29 NOTE — MAU Note (Signed)
Adriana Hansen is a 35 y.o. at [redacted]w[redacted]d here in MAU reporting: here for follow up hcg. Is still bleeding, states it is like a period. Having some intermittent cramping but none at this time.  Onset of complaint: ongoing  Pain score: 0/10  Vitals:   04/29/20 0908  BP: 138/87  Pulse: 98  Resp: 16  Temp: 98.5 F (36.9 C)  SpO2: 100%     Lab orders placed from triage: hcg, cbc, cmp

## 2020-05-06 DIAGNOSIS — O209 Hemorrhage in early pregnancy, unspecified: Secondary | ICD-10-CM | POA: Diagnosis not present

## 2020-05-13 DIAGNOSIS — O209 Hemorrhage in early pregnancy, unspecified: Secondary | ICD-10-CM | POA: Diagnosis not present

## 2020-05-15 ENCOUNTER — Other Ambulatory Visit: Payer: Self-pay | Admitting: Internal Medicine

## 2020-05-15 DIAGNOSIS — F988 Other specified behavioral and emotional disorders with onset usually occurring in childhood and adolescence: Secondary | ICD-10-CM

## 2020-05-19 MED ORDER — AMPHETAMINE-DEXTROAMPHETAMINE 15 MG PO TABS
15.0000 mg | ORAL_TABLET | Freq: Two times a day (BID) | ORAL | 0 refills | Status: DC
Start: 2020-05-19 — End: 2020-06-17

## 2020-05-20 DIAGNOSIS — O021 Missed abortion: Secondary | ICD-10-CM | POA: Diagnosis not present

## 2020-05-27 DIAGNOSIS — O021 Missed abortion: Secondary | ICD-10-CM | POA: Diagnosis not present

## 2020-06-03 DIAGNOSIS — O021 Missed abortion: Secondary | ICD-10-CM | POA: Diagnosis not present

## 2020-06-10 DIAGNOSIS — O021 Missed abortion: Secondary | ICD-10-CM | POA: Diagnosis not present

## 2020-06-17 ENCOUNTER — Other Ambulatory Visit: Payer: Self-pay | Admitting: Internal Medicine

## 2020-06-17 DIAGNOSIS — O021 Missed abortion: Secondary | ICD-10-CM | POA: Diagnosis not present

## 2020-06-17 DIAGNOSIS — F988 Other specified behavioral and emotional disorders with onset usually occurring in childhood and adolescence: Secondary | ICD-10-CM

## 2020-06-17 MED ORDER — AMPHETAMINE-DEXTROAMPHETAMINE 15 MG PO TABS: 15.0000 mg | ORAL_TABLET | Freq: Two times a day (BID) | ORAL | 0 refills | Status: DC

## 2020-06-24 DIAGNOSIS — O009 Unspecified ectopic pregnancy without intrauterine pregnancy: Secondary | ICD-10-CM | POA: Diagnosis not present

## 2020-06-24 DIAGNOSIS — Z01419 Encounter for gynecological examination (general) (routine) without abnormal findings: Secondary | ICD-10-CM | POA: Diagnosis not present

## 2020-06-24 DIAGNOSIS — Z130 Encounter for screening for diseases of the blood and blood-forming organs and certain disorders involving the immune mechanism: Secondary | ICD-10-CM | POA: Diagnosis not present

## 2020-06-24 DIAGNOSIS — Z6832 Body mass index (BMI) 32.0-32.9, adult: Secondary | ICD-10-CM | POA: Diagnosis not present

## 2020-06-24 DIAGNOSIS — Z1389 Encounter for screening for other disorder: Secondary | ICD-10-CM | POA: Diagnosis not present

## 2020-06-24 DIAGNOSIS — Z113 Encounter for screening for infections with a predominantly sexual mode of transmission: Secondary | ICD-10-CM | POA: Diagnosis not present

## 2020-07-16 ENCOUNTER — Other Ambulatory Visit: Payer: Self-pay | Admitting: Internal Medicine

## 2020-07-16 DIAGNOSIS — F988 Other specified behavioral and emotional disorders with onset usually occurring in childhood and adolescence: Secondary | ICD-10-CM

## 2020-07-17 MED ORDER — AMPHETAMINE-DEXTROAMPHETAMINE 15 MG PO TABS: 15.0000 mg | ORAL_TABLET | Freq: Two times a day (BID) | ORAL | 0 refills | Status: DC

## 2020-08-13 ENCOUNTER — Other Ambulatory Visit: Payer: Self-pay | Admitting: Internal Medicine

## 2020-08-13 DIAGNOSIS — F988 Other specified behavioral and emotional disorders with onset usually occurring in childhood and adolescence: Secondary | ICD-10-CM

## 2020-08-13 MED ORDER — AMPHETAMINE-DEXTROAMPHETAMINE 15 MG PO TABS: 15.0000 mg | ORAL_TABLET | Freq: Two times a day (BID) | ORAL | 0 refills | Status: DC

## 2020-09-11 ENCOUNTER — Encounter: Payer: BC Managed Care – PPO | Admitting: Internal Medicine

## 2020-09-12 ENCOUNTER — Other Ambulatory Visit: Payer: Self-pay | Admitting: Internal Medicine

## 2020-09-12 DIAGNOSIS — F988 Other specified behavioral and emotional disorders with onset usually occurring in childhood and adolescence: Secondary | ICD-10-CM

## 2020-09-14 MED ORDER — AMPHETAMINE-DEXTROAMPHETAMINE 15 MG PO TABS: 15.0000 mg | ORAL_TABLET | Freq: Two times a day (BID) | ORAL | 0 refills | Status: DC

## 2020-09-16 ENCOUNTER — Other Ambulatory Visit: Payer: Self-pay

## 2020-09-16 NOTE — Patient Instructions (Addendum)
Blood work was ordered.     Medications changes include :   none  Your prescription(s) have been submitted to your pharmacy. Please take as directed and contact our office if you believe you are having problem(s) with the medication(s).    Please followup in 6 months    Health Maintenance, Female Adopting a healthy lifestyle and getting preventive care are important in promoting health and wellness. Ask your health care provider about: The right schedule for you to have regular tests and exams. Things you can do on your own to prevent diseases and keep yourself healthy. What should I know about diet, weight, and exercise? Eat a healthy diet  Eat a diet that includes plenty of vegetables, fruits, low-fat dairy products, and lean protein. Do not eat a lot of foods that are high in solid fats, added sugars, or sodium.  Maintain a healthy weight Body mass index (BMI) is used to identify weight problems. It estimates body fat based on height and weight. Your health care provider can help determineyour BMI and help you achieve or maintain a healthy weight. Get regular exercise Get regular exercise. This is one of the most important things you can do for your health. Most adults should: Exercise for at least 150 minutes each week. The exercise should increase your heart rate and make you sweat (moderate-intensity exercise). Do strengthening exercises at least twice a week. This is in addition to the moderate-intensity exercise. Spend less time sitting. Even light physical activity can be beneficial. Watch cholesterol and blood lipids Have your blood tested for lipids and cholesterol at 37 years of age, then havethis test every 5 years. Have your cholesterol levels checked more often if: Your lipid or cholesterol levels are high. You are older than 37 years of age. You are at high risk for heart disease. What should I know about cancer screening? Depending on your health history and  family history, you may need to have cancer screening at various ages. This may include screening for: Breast cancer. Cervical cancer. Colorectal cancer. Skin cancer. Lung cancer. What should I know about heart disease, diabetes, and high blood pressure? Blood pressure and heart disease High blood pressure causes heart disease and increases the risk of stroke. This is more likely to develop in people who have high blood pressure readings, are of African descent, or are overweight. Have your blood pressure checked: Every 3-5 years if you are 18-39 years of age. Every year if you are 40 years old or older. Diabetes Have regular diabetes screenings. This checks your fasting blood sugar level. Have the screening done: Once every three years after age 40 if you are at a normal weight and have a low risk for diabetes. More often and at a younger age if you are overweight or have a high risk for diabetes. What should I know about preventing infection? Hepatitis B If you have a higher risk for hepatitis B, you should be screened for this virus. Talk with your health care provider to find out if you are at risk forhepatitis B infection. Hepatitis C Testing is recommended for: Everyone born from 1945 through 1965. Anyone with known risk factors for hepatitis C. Sexually transmitted infections (STIs) Get screened for STIs, including gonorrhea and chlamydia, if: You are sexually active and are younger than 37 years of age. You are older than 37 years of age and your health care provider tells you that you are at risk for this type of infection. Your sexual   activity has changed since you were last screened, and you are at increased risk for chlamydia or gonorrhea. Ask your health care provider if you are at risk. Ask your health care provider about whether you are at high risk for HIV. Your health care provider may recommend a prescription medicine to help prevent HIV infection. If you choose to take  medicine to prevent HIV, you should first get tested for HIV. You should then be tested every 3 months for as long as you are taking the medicine. Pregnancy If you are about to stop having your period (premenopausal) and you may become pregnant, seek counseling before you get pregnant. Take 400 to 800 micrograms (mcg) of folic acid every day if you become pregnant. Ask for birth control (contraception) if you want to prevent pregnancy. Osteoporosis and menopause Osteoporosis is a disease in which the bones lose minerals and strength with aging. This can result in bone fractures. If you are 65 years old or older, or if you are at risk for osteoporosis and fractures, ask your health care provider if you should: Be screened for bone loss. Take a calcium or vitamin D supplement to lower your risk of fractures. Be given hormone replacement therapy (HRT) to treat symptoms of menopause. Follow these instructions at home: Lifestyle Do not use any products that contain nicotine or tobacco, such as cigarettes, e-cigarettes, and chewing tobacco. If you need help quitting, ask your health care provider. Do not use street drugs. Do not share needles. Ask your health care provider for help if you need support or information about quitting drugs. Alcohol use Do not drink alcohol if: Your health care provider tells you not to drink. You are pregnant, may be pregnant, or are planning to become pregnant. If you drink alcohol: Limit how much you use to 0-1 drink a day. Limit intake if you are breastfeeding. Be aware of how much alcohol is in your drink. In the [place] one drink equals one 12 oz bottle of beer (355 mL), one 5 oz glass of wine (148 mL), or one 1 oz glass of hard liquor (44 mL). General instructions Schedule regular health, dental, and eye exams. Stay current with your vaccines. Tell your health care provider if: You often feel depressed. You have ever been abused or do not feel safe at  home. Summary Adopting a healthy lifestyle and getting preventive care are important in promoting health and wellness. Follow your health care provider's instructions about healthy diet, exercising, and getting tested or screened for diseases. Follow your health care provider's instructions on monitoring your cholesterol and blood pressure. This information is not intended to replace advice given to you by your health care provider. Make sure you discuss any questions you have with your healthcare provider. Document Revised: 03/08/2018 Document Reviewed: 03/08/2018 Elsevier Patient Education  2022 Elsevier Inc.  

## 2020-09-16 NOTE — Progress Notes (Signed)
Subjective:    Patient ID: Adriana Hansen, female    DOB: [birth year], 35 y.o.   MRN: [id number]   This visit occurred during the SARS-CoV-2 public health emergency.  Safety protocols were in place, including screening questions prior to the visit, additional usage of staff PPE, and extensive cleaning of exam room while observing appropriate contact time as indicated for disinfecting solutions.    HPI She is here for a physical exam.   She has no concerns.  She denies any significant changes in her history.  She did have an ectopic pregnancy earlier this year.  Medications and allergies reviewed with patient and updated if appropriate.  Patient Active Problem List   Diagnosis Date Noted   Onychomycosis 09/12/2017   Tobacco dependence due to cigarettes 08/02/2017   Toenail deformity 08/02/2017   Obesity (BMI 30.0-34.9) 02/02/2017   Numbness of fingers of both hands 02/07/2015   Attention deficit disorder 09/01/2006    Current Outpatient Medications on File Prior to Visit  Medication Sig Dispense Refill   amphetamine-dextroamphetamine (ADDERALL) 15 MG tablet Take 1 tablet by mouth 2 (two) times daily. 60 tablet 0   No current facility-administered medications on file prior to visit.    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 Right Salpingooophrectomy,Evacuation of  Hematoperitoneum   LAPAROSCOPY N/A 09/28/2018   Procedure: LAPAROSCOPY DIAGNOSTIC;  Surgeon: Sherian Rein, MD;  Location: [facility];  Service: Gynecology;  Laterality: N/A;  possible   TONSILLECTOMY  child   UTERINE SEPTUM RESECTION N/A 09/28/2018   Procedure: RESECTION OF UTERINE SEPTUM;  Surgeon: Sherian Rein, MD;  Location: [facility];  Service: Gynecology;  Laterality: N/A;  request RNFA or extra tech   WISDOM TOOTH EXTRACTION      Social History   Socioeconomic History   Marital status: Single    Spouse name: Not on file   Number of children: Not on file   Years of education: Not on file   Highest education level: Not on file  Occupational History   Not on file  Tobacco Use   Smoking status: Every Day    Packs/day: 0.50    Years: 15.00    Pack years: 7.50    Types: Cigarettes   Smokeless tobacco: Never  Vaping Use   Vaping Use: Never used  Substance and Sexual Activity   Alcohol use: Not Currently    Comment: rare   Drug use: Never   Sexual activity: Yes    Birth control/protection: None  Other Topics Concern   Not on file  Social History Narrative   Acute, no regular exercise   Social Determinants of Health   Financial Resource Strain: Not on file  Food Insecurity: Not on file  Transportation Needs: Not on file  Physical Activity: Not on file  Stress: Not on file  Social Connections: Not on  file    Family History  Problem Relation Age of Onset   Hyperlipidemia Father    Hypertension Father    Hyperlipidemia Brother    Hyperlipidemia Maternal Grandfather    Hypertension Maternal Grandfather    Parkinson's disease Maternal Grandfather    Hyperlipidemia Mother    Hyperlipidemia Maternal Grandmother    Cancer Neg Hx    Stroke Neg Hx    Diabetes Neg Hx    Heart disease Neg Hx     Review of Systems  Constitutional:  Negative for chills and fever.  Eyes:  Negative for visual disturbance.  Respiratory:   Positive for cough (allergy related). Negative for shortness of breath and wheezing.   Cardiovascular:  Negative for chest pain, palpitations and leg swelling.  Gastrointestinal:  Negative for abdominal pain, blood in stool, constipation, diarrhea and nausea.       No gerd  Genitourinary:  Negative for dysuria.  Musculoskeletal:  Negative for arthralgias and back pain.  Skin:  Negative for color change and rash.  Neurological:  Negative for dizziness, light-headedness and headaches.  Psychiatric/Behavioral:  Negative for dysphoric mood. The patient is not nervous/anxious.       Objective:   Vitals:   09/17/20 0929  BP: 126/82  Pulse: 93  Temp: 99.1 F (37.3 C)  SpO2: 96%   Filed Weights   09/17/20 0929  Weight: 195 lb (88.5 kg)   Body mass index is 32.45 kg/m.  BP Readings from Last 3 Encounters:  09/17/20 126/82  04/29/20 138/87  04/26/20 129/78    Wt Readings from Last 3 Encounters:  09/17/20 195 lb (88.5 kg)  04/29/20 206 lb 8 oz (93.7 kg)  04/26/20 207 lb 9.6 oz (94.2 kg)     Physical Exam Constitutional: She appears well-developed and well-nourished. No distress.  HENT:  Head: Normocephalic and atraumatic.  Right Ear: External ear normal. Normal ear canal and TM Left Ear: External ear normal.  Normal ear canal and TM Mouth/Throat: Oropharynx is clear and moist.  Eyes: Conjunctivae and EOM are normal.  Neck: Neck supple. No tracheal deviation present. No thyromegaly present.  No carotid bruit  Cardiovascular: Normal rate, regular rhythm and normal heart sounds.   No murmur heard.  No edema. Pulmonary/Chest: Effort normal and breath sounds normal. No respiratory distress. She has no wheezes. She has no rales.  Breast: deferred   Abdominal: Soft. She exhibits no distension. There is no tenderness.  Lymphadenopathy: She has no cervical adenopathy.  Skin: Skin is warm and dry. She is not diaphoretic.  Psychiatric: She has a normal mood and affect. Her behavior  is normal.        Assessment & Plan:   Physical exam: Screening blood work    ordered Immunizations  did not have Covid booster Gyn  Up to date   Exercise  yard work Weight  advised weight loss Substance abuse smoking cigarettes-stressed cessation-she knows she should quit, but does not want to quit at this time.      See Problem List for Assessment and Plan of chronic medical problems.

## 2020-09-17 ENCOUNTER — Encounter: Payer: Self-pay | Admitting: Internal Medicine

## 2020-09-17 ENCOUNTER — Ambulatory Visit (INDEPENDENT_AMBULATORY_CARE_PROVIDER_SITE_OTHER): Payer: BC Managed Care – PPO | Admitting: Internal Medicine

## 2020-09-17 VITALS — BP 126/82 | HR 93 | Temp 99.1°F | Ht 65.0 in | Wt 195.0 lb

## 2020-09-17 DIAGNOSIS — Z0000 Encounter for general adult medical examination without abnormal findings: Secondary | ICD-10-CM | POA: Diagnosis not present

## 2020-09-17 DIAGNOSIS — E669 Obesity, unspecified: Secondary | ICD-10-CM

## 2020-09-17 DIAGNOSIS — F988 Other specified behavioral and emotional disorders with onset usually occurring in childhood and adolescence: Secondary | ICD-10-CM | POA: Diagnosis not present

## 2020-09-17 DIAGNOSIS — F17210 Nicotine dependence, cigarettes, uncomplicated: Secondary | ICD-10-CM

## 2020-09-17 DIAGNOSIS — E66811 Obesity, class 1: Secondary | ICD-10-CM

## 2020-09-17 LAB — LIPID PANEL
Cholesterol: 99 mg/dL (ref 0–200)
HDL: 28.7 mg/dL — ABNORMAL LOW (ref >39.00)
LDL Cholesterol: 57 mg/dL (ref 0–99)
NonHDL: 70.74
Total CHOL/HDL Ratio: 3
Triglycerides: 71 mg/dL (ref 0.0–149.0)
VLDL: 14.2 mg/dL (ref 0.0–40.0)

## 2020-09-17 LAB — CBC WITH DIFFERENTIAL/PLATELET
Basophils Absolute: 0.1 10*3/uL (ref 0.0–0.1)
Basophils Relative: 0.6 % (ref 0.0–3.0)
Eosinophils Absolute: 0.2 10*3/uL (ref 0.0–0.7)
Eosinophils Relative: 1.8 % (ref 0.0–5.0)
HCT: 39.6 % (ref 36.0–46.0)
Hemoglobin: 13.6 g/dL (ref 12.0–15.0)
Lymphocytes Relative: 22 % (ref 12.0–46.0)
Lymphs Abs: 2.5 10*3/uL (ref 0.7–4.0)
MCHC: 34.2 g/dL (ref 30.0–36.0)
MCV: 86.5 fl (ref 78.0–100.0)
Monocytes Absolute: 1 10*3/uL (ref 0.1–1.0)
Monocytes Relative: 8.5 % (ref 3.0–12.0)
Neutro Abs: 7.6 10*3/uL (ref 1.4–7.7)
Neutrophils Relative %: 67.1 % (ref 43.0–77.0)
Platelets: 413 10*3/uL — ABNORMAL HIGH (ref 150.0–400.0)
RBC: 4.58 Mil/uL (ref 3.87–5.11)
RDW: 13.9 % (ref 11.5–15.5)
WBC: 11.4 10*3/uL — ABNORMAL HIGH (ref 4.0–10.5)

## 2020-09-17 LAB — COMPREHENSIVE METABOLIC PANEL
ALT: 13 U/L (ref 0–35)
AST: 12 U/L (ref 0–37)
Albumin: 4.4 g/dL (ref 3.5–5.2)
Alkaline Phosphatase: 96 U/L (ref 39–117)
BUN: 8 mg/dL (ref 6–23)
CO2: 24 mEq/L (ref 19–32)
Calcium: 9 mg/dL (ref 8.4–10.5)
Chloride: 107 mEq/L (ref 96–112)
Creatinine, Ser: 0.66 mg/dL (ref 0.40–1.20)
GFR: 112.09 mL/min (ref >60.00)
Glucose, Bld: 86 mg/dL (ref 70–99)
Potassium: 4.1 mEq/L (ref 3.5–5.1)
Sodium: 139 mEq/L (ref 135–145)
Total Bilirubin: 0.4 mg/dL (ref 0.2–1.2)
Total Protein: 6.9 g/dL (ref 6.0–8.3)

## 2020-09-17 LAB — TSH: TSH: 1.85 u[IU]/mL (ref 0.35–4.50)

## 2020-09-17 NOTE — Assessment & Plan Note (Signed)
Chronic Knows she needs quit, but does not want to quit

## 2020-09-17 NOTE — Assessment & Plan Note (Signed)
Chronic Controlled, stable Continue Adderall 15 mg bid

## 2020-09-17 NOTE — Assessment & Plan Note (Signed)
Chronic Encouraged weight loss Encourage more formal regular exercise

## 2020-10-12 ENCOUNTER — Other Ambulatory Visit: Payer: Self-pay | Admitting: Internal Medicine

## 2020-10-12 DIAGNOSIS — F988 Other specified behavioral and emotional disorders with onset usually occurring in childhood and adolescence: Secondary | ICD-10-CM

## 2020-10-13 ENCOUNTER — Other Ambulatory Visit: Payer: Self-pay | Admitting: Internal Medicine

## 2020-10-13 DIAGNOSIS — F988 Other specified behavioral and emotional disorders with onset usually occurring in childhood and adolescence: Secondary | ICD-10-CM

## 2020-10-13 MED ORDER — AMPHETAMINE-DEXTROAMPHETAMINE 15 MG PO TABS
15.0000 mg | ORAL_TABLET | Freq: Two times a day (BID) | ORAL | 0 refills | Status: DC
Start: 2020-10-13 — End: 2020-11-18

## 2020-11-17 ENCOUNTER — Telehealth: Payer: Self-pay | Admitting: Internal Medicine

## 2020-11-17 DIAGNOSIS — F988 Other specified behavioral and emotional disorders with onset usually occurring in childhood and adolescence: Secondary | ICD-10-CM

## 2020-11-17 NOTE — Telephone Encounter (Signed)
1.Medication Requested: amphetamine-dextroamphetamine (ADDERALL) 15 MG tablet   2. Pharmacy (Name, Street, [place]): [facility] [id number] - [place], [place] - [address] 3. On Med List: Y  4. Last Visit with PCP: 09/17/20  5. Next visit date with PCP: 03/18/21   Agent: Please be advised that RX refills may take up to 3 business days. We ask that you follow-up with your pharmacy.

## 2020-11-18 MED ORDER — AMPHETAMINE-DEXTROAMPHETAMINE 15 MG PO TABS
15.0000 mg | ORAL_TABLET | Freq: Two times a day (BID) | ORAL | 0 refills | Status: DC
Start: 2020-11-18 — End: 2020-12-17

## 2020-12-17 ENCOUNTER — Other Ambulatory Visit: Payer: Self-pay | Admitting: Internal Medicine

## 2020-12-17 DIAGNOSIS — F988 Other specified behavioral and emotional disorders with onset usually occurring in childhood and adolescence: Secondary | ICD-10-CM

## 2020-12-18 MED ORDER — AMPHETAMINE-DEXTROAMPHETAMINE 15 MG PO TABS: 15.0000 mg | ORAL_TABLET | Freq: Two times a day (BID) | ORAL | 0 refills | Status: DC

## 2021-01-15 ENCOUNTER — Other Ambulatory Visit: Payer: Self-pay | Admitting: Internal Medicine

## 2021-01-15 DIAGNOSIS — F988 Other specified behavioral and emotional disorders with onset usually occurring in childhood and adolescence: Secondary | ICD-10-CM

## 2021-01-16 MED ORDER — AMPHETAMINE-DEXTROAMPHETAMINE 15 MG PO TABS: 15.0000 mg | ORAL_TABLET | Freq: Two times a day (BID) | ORAL | 0 refills | Status: DC

## 2021-02-15 ENCOUNTER — Other Ambulatory Visit: Payer: Self-pay | Admitting: Internal Medicine

## 2021-02-15 DIAGNOSIS — F988 Other specified behavioral and emotional disorders with onset usually occurring in childhood and adolescence: Secondary | ICD-10-CM

## 2021-02-16 MED ORDER — AMPHETAMINE-DEXTROAMPHETAMINE 15 MG PO TABS
15.0000 mg | ORAL_TABLET | Freq: Two times a day (BID) | ORAL | 0 refills | Status: DC
Start: 2021-02-16 — End: 2021-03-18

## 2021-03-17 ENCOUNTER — Encounter: Payer: Self-pay | Admitting: Internal Medicine

## 2021-03-17 NOTE — Progress Notes (Signed)
Subjective:    Patient ID: Adriana Hansen, female    DOB: [birth year], 35 y.o.   MRN: [id number]  This visit occurred during the SARS-CoV-2 public health emergency.  Safety protocols were in place, including screening questions prior to the visit, additional usage of staff PPE, and extensive cleaning of exam room while observing appropriate contact time as indicated for disinfecting solutions.     HPI The patient is here for follow up of their chronic medical problems, including ADD  She is taking her medication as prescribed.  She feels the medication is effective.  She denies side effects, including palpitations, headaches, lightheadedness, decreased appetite and weight loss.     She is due for her rx.   Medications and allergies reviewed with patient and updated if appropriate.  Patient Active Problem List   Diagnosis Date Noted   Onychomycosis 09/12/2017   Tobacco dependence due to cigarettes 08/02/2017   Toenail deformity 08/02/2017   Obesity (BMI 30.0-34.9) 02/02/2017   Numbness of fingers of both hands 02/07/2015   Attention deficit disorder 09/01/2006    Current Outpatient Medications on File Prior to Visit  Medication Sig Dispense Refill   amphetamine-dextroamphetamine (ADDERALL) 15 MG tablet Take 1 tablet by mouth 2 (two) times daily. 60 tablet 0   No current facility-administered medications on file prior to visit.    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 Right Salpingooophrectomy,Evacuation of Hematoperitoneum   LAPAROSCOPY N/A 09/28/2018   Procedure: LAPAROSCOPY DIAGNOSTIC;  Surgeon: Sherian Rein, MD;  Location: [facility];  Service: Gynecology;  Laterality: N/A;  possible   TONSILLECTOMY  child   UTERINE SEPTUM RESECTION N/A 09/28/2018   Procedure: RESECTION OF UTERINE SEPTUM;  Surgeon: Sherian Rein, MD;  Location: [facility];  Service: Gynecology;  Laterality: N/A;  request RNFA or extra tech   WISDOM TOOTH EXTRACTION      Social History   Socioeconomic History   Marital status: Single    Spouse name: Not on file   Number of children: Not on file   Years of education: Not on file   Highest education level: Not on file  Occupational History   Not on file  Tobacco Use   Smoking status: Every Day    Packs/day: 0.50    Years: 15.00    Pack years: 7.50    Types: Cigarettes   Smokeless tobacco: Never  Vaping Use   Vaping Use: Never used  Substance and Sexual Activity   Alcohol use: Not Currently    Comment: rare   Drug use: Never   Sexual activity: Yes    Birth control/protection: None  Other Topics Concern   Not on file  Social History Narrative   Acute, no regular exercise   Social Determinants of Health   Financial Resource Strain: Not on file  Food Insecurity: Not  on file  Transportation Needs: Not on file  Physical Activity: Not on file  Stress: Not on file  Social Connections: Not on file    Family History  Problem Relation Age of Onset   Hyperlipidemia Father    Hypertension Father    Hyperlipidemia Brother    Hyperlipidemia Maternal Grandfather    Hypertension Maternal Grandfather    Parkinson's disease Maternal Grandfather    Hyperlipidemia Mother    Hyperlipidemia Maternal Grandmother    Cancer Neg Hx    Stroke Neg Hx    Diabetes Neg Hx    Heart  disease Neg Hx     Review of Systems Per HPI    Objective:   Vitals:   03/18/21 0752  BP: 120/82  Pulse: 90  Temp: 98.4 F (36.9 C)  SpO2: 99%   BP Readings from Last 3 Encounters:  03/18/21 120/82  09/17/20 126/82  04/29/20 138/87   Wt Readings from Last 3 Encounters:  03/18/21 197 lb (89.4 kg)  09/17/20 195 lb (88.5 kg)  04/29/20 206 lb 8 oz (93.7 kg)   Body mass index is 32.78 kg/m.  Depression screen [facility] 2/9 03/18/2021 09/11/2019 08/17/2018 08/02/2017  Decreased Interest 0 0 0 0  Down, Depressed, Hopeless 0 0 0 0  PHQ - 2 Score 0 0 0 0  Altered sleeping 1 - - -  Tired, decreased energy 1 - - -  Change in appetite 0 - - -  Feeling bad or failure about yourself  0 - - -  Trouble concentrating 0 - - -  Moving slowly or fidgety/restless 0 - - -  Suicidal thoughts 0 - - -  PHQ-9 Score 2 - - -  Difficult doing work/chores Not difficult at all - - -    GAD 7 : Generalized Anxiety Score 03/18/2021  Nervous, Anxious, on Edge 1  Control/stop worrying 0  Worry too much - different things 1  Trouble relaxing 0  Restless 0  Easily annoyed or irritable 1  Afraid - awful might happen 0  Total GAD 7 Score 3  Anxiety Difficulty Not difficult at all       Physical Exam    Constitutional: Appears well-developed and well-nourished. No distress.  Skin: Skin is warm and dry. Not diaphoretic.  Psychiatric: Normal mood and affect. Behavior is normal.      Assessment & Plan:    Screened for depression using the PHQ 9 scale.  No evidence of depression.   Screened for anxiety using GAD7 Scale.  She does have some mild anxiety that she relates to the holidays only and not generalized anxiety.   See Problem List for Assessment and Plan of chronic medical problems.

## 2021-03-17 NOTE — Patient Instructions (Addendum)
  Medications changes include :   none  Your prescription(s) have been submitted to your pharmacy. Please take as directed and contact our office if you believe you are having problem(s) with the medication(s).    Please followup in 6 months   

## 2021-03-18 ENCOUNTER — Ambulatory Visit (INDEPENDENT_AMBULATORY_CARE_PROVIDER_SITE_OTHER): Payer: BC Managed Care – PPO | Admitting: Internal Medicine

## 2021-03-18 ENCOUNTER — Other Ambulatory Visit: Payer: Self-pay

## 2021-03-18 VITALS — BP 120/82 | HR 90 | Temp 98.4°F | Ht 65.0 in | Wt 197.0 lb

## 2021-03-18 DIAGNOSIS — F988 Other specified behavioral and emotional disorders with onset usually occurring in childhood and adolescence: Secondary | ICD-10-CM | POA: Diagnosis not present

## 2021-03-18 DIAGNOSIS — Z1331 Encounter for screening for depression: Secondary | ICD-10-CM | POA: Diagnosis not present

## 2021-03-18 MED ORDER — AMPHETAMINE-DEXTROAMPHETAMINE 15 MG PO TABS: 15.0000 mg | ORAL_TABLET | Freq: Two times a day (BID) | ORAL | 0 refills | Status: DC

## 2021-03-18 NOTE — Assessment & Plan Note (Signed)
Chronic Controlled, stable Continue Adderall 15 mg bid - due for refill - sent in today

## 2021-04-16 ENCOUNTER — Other Ambulatory Visit: Payer: Self-pay | Admitting: Internal Medicine

## 2021-04-16 DIAGNOSIS — F988 Other specified behavioral and emotional disorders with onset usually occurring in childhood and adolescence: Secondary | ICD-10-CM

## 2021-04-17 MED ORDER — AMPHETAMINE-DEXTROAMPHETAMINE 15 MG PO TABS: 15.0000 mg | ORAL_TABLET | Freq: Two times a day (BID) | ORAL | 0 refills | Status: DC

## 2021-05-15 ENCOUNTER — Encounter: Payer: Self-pay | Admitting: Internal Medicine

## 2021-05-20 ENCOUNTER — Other Ambulatory Visit: Payer: Self-pay | Admitting: Internal Medicine

## 2021-05-20 DIAGNOSIS — F988 Other specified behavioral and emotional disorders with onset usually occurring in childhood and adolescence: Secondary | ICD-10-CM

## 2021-05-21 MED ORDER — AMPHETAMINE-DEXTROAMPHETAMINE 15 MG PO TABS: 15.0000 mg | ORAL_TABLET | Freq: Two times a day (BID) | ORAL | 0 refills | Status: DC

## 2021-06-17 ENCOUNTER — Other Ambulatory Visit: Payer: Self-pay | Admitting: Internal Medicine

## 2021-06-17 DIAGNOSIS — F988 Other specified behavioral and emotional disorders with onset usually occurring in childhood and adolescence: Secondary | ICD-10-CM

## 2021-06-18 MED ORDER — AMPHETAMINE-DEXTROAMPHETAMINE 15 MG PO TABS: 15.0000 mg | ORAL_TABLET | Freq: Two times a day (BID) | ORAL | 0 refills | Status: DC

## 2021-07-16 ENCOUNTER — Other Ambulatory Visit: Payer: Self-pay | Admitting: Internal Medicine

## 2021-07-16 DIAGNOSIS — F988 Other specified behavioral and emotional disorders with onset usually occurring in childhood and adolescence: Secondary | ICD-10-CM

## 2021-07-17 MED ORDER — AMPHETAMINE-DEXTROAMPHETAMINE 15 MG PO TABS: 15.0000 mg | ORAL_TABLET | Freq: Two times a day (BID) | ORAL | 0 refills | Status: DC

## 2021-07-17 NOTE — Telephone Encounter (Signed)
LOV: 03/18/21 Next OV: 09/17/21 

## 2021-08-16 ENCOUNTER — Other Ambulatory Visit: Payer: Self-pay | Admitting: Internal Medicine

## 2021-08-16 DIAGNOSIS — F988 Other specified behavioral and emotional disorders with onset usually occurring in childhood and adolescence: Secondary | ICD-10-CM

## 2021-08-17 MED ORDER — AMPHETAMINE-DEXTROAMPHETAMINE 15 MG PO TABS: 15.0000 mg | ORAL_TABLET | Freq: Two times a day (BID) | ORAL | 0 refills | Status: DC

## 2021-08-17 NOTE — Telephone Encounter (Signed)
LOV: 03/18/21 Next OV: 09/17/21

## 2021-09-13 NOTE — Progress Notes (Signed)
      Subjective:    Patient ID: Adriana Hansen, female    DOB: 09-18-1985, 36 y.o.   MRN: [id number]     HPI Adriana Hansen is here for follow up of her chronic medical problems, including ADD  ADD:  She is taking her medication as prescribed.  She feels the medication is effective.  She denies side effects, including palpitations, headaches, lightheadedness, decreased appetite and weight loss.    BP a little elevated - she did slam her thumb in the car door when she was coming in here.  She has mild bruising on the thumb nail and some pain.     Medications and allergies reviewed with patient and updated if appropriate.  Current Outpatient Medications on File Prior to Visit  Medication Sig Dispense Refill   amphetamine-dextroamphetamine (ADDERALL) 15 MG tablet Take 1 tablet by mouth 2 (two) times daily. 60 tablet 0   No current facility-administered medications on file prior to visit.     Review of Systems     Objective:   Vitals:   09/17/21 0826  BP: (!) 148/90  Pulse: 93  Temp: 98.3 F (36.8 C)  SpO2: 96%   BP Readings from Last 3 Encounters:  09/17/21 (!) 148/90  03/18/21 120/82  09/17/20 126/82   Wt Readings from Last 3 Encounters:  09/17/21 206 lb (93.4 kg)  03/18/21 197 lb (89.4 kg)  09/17/20 195 lb (88.5 kg)   Body mass index is 34.28 kg/m.    Physical Exam Constitutional:      General: She is not in acute distress.    Appearance: Normal appearance.  HENT:     Head: Normocephalic and atraumatic.  Skin:    General: Skin is warm and dry.     Comments: Bruising right thumb nail, minimal swelling in distal right thumb, w/o deformity, mild tenderness  Neurological:     Mental Status: She is alert. Mental status is at baseline.  Psychiatric:        Mood and Affect: Mood normal.        Behavior: Behavior normal.        Thought Content: Thought content normal.        Judgment: Judgment normal.        Lab Results  Component Value Date   WBC 11.4 (H)  09/17/2020   HGB 13.6 09/17/2020   HCT 39.6 09/17/2020   PLT 413.0 (H) 09/17/2020   GLUCOSE 86 09/17/2020   CHOL 99 09/17/2020   TRIG 71.0 09/17/2020   HDL 28.70 (L) 09/17/2020   LDLCALC 57 09/17/2020   ALT 13 09/17/2020   AST 12 09/17/2020   NA 139 09/17/2020   K 4.1 09/17/2020   CL 107 09/17/2020   CREATININE 0.66 09/17/2020   BUN 8 09/17/2020   CO2 24 09/17/2020   TSH 1.85 09/17/2020     Assessment & Plan:    See Problem List for Assessment and Plan of chronic medical problems.

## 2021-09-13 NOTE — Patient Instructions (Addendum)
      Medications changes include :   none      Return in about 6 months (around 03/19/2022) for Physical Exam.

## 2021-09-15 ENCOUNTER — Other Ambulatory Visit: Payer: Self-pay | Admitting: Internal Medicine

## 2021-09-15 DIAGNOSIS — F988 Other specified behavioral and emotional disorders with onset usually occurring in childhood and adolescence: Secondary | ICD-10-CM

## 2021-09-16 NOTE — Telephone Encounter (Signed)
Check [place] registry last filled 08/17/2021.Marland Kitchen/LMB

## 2021-09-17 ENCOUNTER — Ambulatory Visit (INDEPENDENT_AMBULATORY_CARE_PROVIDER_SITE_OTHER): Payer: Commercial Managed Care - HMO | Admitting: Internal Medicine

## 2021-09-17 ENCOUNTER — Encounter: Payer: Self-pay | Admitting: Internal Medicine

## 2021-09-17 DIAGNOSIS — R030 Elevated blood-pressure reading, without diagnosis of hypertension: Secondary | ICD-10-CM | POA: Insufficient documentation

## 2021-09-17 DIAGNOSIS — F988 Other specified behavioral and emotional disorders with onset usually occurring in childhood and adolescence: Secondary | ICD-10-CM | POA: Diagnosis not present

## 2021-09-17 MED ORDER — AMPHETAMINE-DEXTROAMPHETAMINE 15 MG PO TABS: 15.0000 mg | ORAL_TABLET | Freq: Two times a day (BID) | ORAL | 0 refills | Status: DC

## 2021-09-17 NOTE — Assessment & Plan Note (Signed)
Chronic Controlled, Stable Continue Adderall 15 mg twice daily Follow-up in 6 months

## 2021-09-17 NOTE — Assessment & Plan Note (Addendum)
New Blood pressure elevated, but typically well controlled Unlikely related to Adderall which she has been on for years and her blood pressure has been controlled We will recheck today before she leaves-BP unchanged We will just monitor for now Advised low-sodium diet, regular exercise, weight loss and smoking cessation since this may indicate that she is at risk for developing hypertension

## 2021-10-13 ENCOUNTER — Other Ambulatory Visit: Payer: Self-pay | Admitting: Internal Medicine

## 2021-10-13 DIAGNOSIS — F988 Other specified behavioral and emotional disorders with onset usually occurring in childhood and adolescence: Secondary | ICD-10-CM

## 2021-10-15 MED ORDER — AMPHETAMINE-DEXTROAMPHETAMINE 15 MG PO TABS: 15.0000 mg | ORAL_TABLET | Freq: Two times a day (BID) | ORAL | 0 refills | Status: DC

## 2021-11-12 ENCOUNTER — Other Ambulatory Visit: Payer: Self-pay | Admitting: Internal Medicine

## 2021-11-12 DIAGNOSIS — F988 Other specified behavioral and emotional disorders with onset usually occurring in childhood and adolescence: Secondary | ICD-10-CM

## 2021-11-16 ENCOUNTER — Other Ambulatory Visit: Payer: Self-pay | Admitting: Internal Medicine

## 2021-11-16 DIAGNOSIS — F988 Other specified behavioral and emotional disorders with onset usually occurring in childhood and adolescence: Secondary | ICD-10-CM

## 2021-11-17 ENCOUNTER — Telehealth: Payer: Self-pay | Admitting: Internal Medicine

## 2021-11-17 DIAGNOSIS — F988 Other specified behavioral and emotional disorders with onset usually occurring in childhood and adolescence: Secondary | ICD-10-CM

## 2021-11-17 MED ORDER — AMPHETAMINE-DEXTROAMPHETAMINE 15 MG PO TABS: 15.0000 mg | ORAL_TABLET | Freq: Two times a day (BID) | ORAL | 0 refills | Status: DC

## 2021-11-17 NOTE — Telephone Encounter (Signed)
Caller & Relationship to patient: Josseline Reddin  Call back number: [phone]  Date of last office visit: 09/17/21  Date of next office visit: 04/01/22  Medication(s) to be refilled: amphetamine-dextroamphetamine (ADDERALL) 15 MG tablet  Preferred Pharmacy:  [facility] #[id number] [place], [place] - [address] AT [facility] [address] [facility] [address] Phone:  [phone]  Fax:  [phone]

## 2021-12-15 ENCOUNTER — Other Ambulatory Visit: Payer: Self-pay | Admitting: Internal Medicine

## 2021-12-15 DIAGNOSIS — F988 Other specified behavioral and emotional disorders with onset usually occurring in childhood and adolescence: Secondary | ICD-10-CM

## 2021-12-15 MED ORDER — AMPHETAMINE-DEXTROAMPHETAMINE 15 MG PO TABS: 15.0000 mg | ORAL_TABLET | Freq: Two times a day (BID) | ORAL | 0 refills | Status: DC

## 2021-12-17 ENCOUNTER — Encounter: Payer: Self-pay | Admitting: Internal Medicine

## 2021-12-17 DIAGNOSIS — F988 Other specified behavioral and emotional disorders with onset usually occurring in childhood and adolescence: Secondary | ICD-10-CM

## 2021-12-17 MED ORDER — AMPHETAMINE-DEXTROAMPHETAMINE 15 MG PO TABS: 15.0000 mg | ORAL_TABLET | Freq: Two times a day (BID) | ORAL | 0 refills | Status: DC

## 2022-01-15 ENCOUNTER — Other Ambulatory Visit: Payer: Self-pay | Admitting: Internal Medicine

## 2022-01-15 DIAGNOSIS — F988 Other specified behavioral and emotional disorders with onset usually occurring in childhood and adolescence: Secondary | ICD-10-CM

## 2022-01-18 MED ORDER — AMPHETAMINE-DEXTROAMPHETAMINE 15 MG PO TABS: 15.0000 mg | ORAL_TABLET | Freq: Two times a day (BID) | ORAL | 0 refills | Status: DC

## 2022-01-19 ENCOUNTER — Other Ambulatory Visit: Payer: Self-pay | Admitting: Internal Medicine

## 2022-01-19 DIAGNOSIS — F988 Other specified behavioral and emotional disorders with onset usually occurring in childhood and adolescence: Secondary | ICD-10-CM

## 2022-01-19 MED ORDER — AMPHETAMINE-DEXTROAMPHETAMINE 15 MG PO TABS: 15.0000 mg | ORAL_TABLET | Freq: Two times a day (BID) | ORAL | 0 refills | Status: DC

## 2022-02-12 IMAGING — US US OB < 14 WEEKS - US OB TV
1 series · 15 of 28 positions shown · non-contrast
Comparison: None.

CLINICAL DATA: Vaginal bleeding

EXAM:
OBSTETRIC <14 WK US AND TRANSVAGINAL OB US
TECHNIQUE: Both transabdominal and transvaginal ultrasound examinations were
performed for complete evaluation of the gestation as well as the
maternal uterus, adnexal regions, and pelvic cul-de-sac.
Transvaginal technique was performed to assess early pregnancy.

[Series 1: us ob < 14 weeks - us ob tv · 15 of 37 slices shown]
[im 1/37]
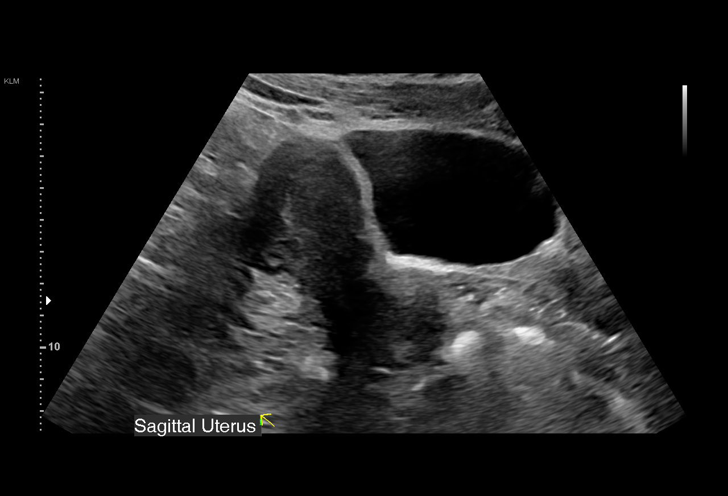
[im 3/37]
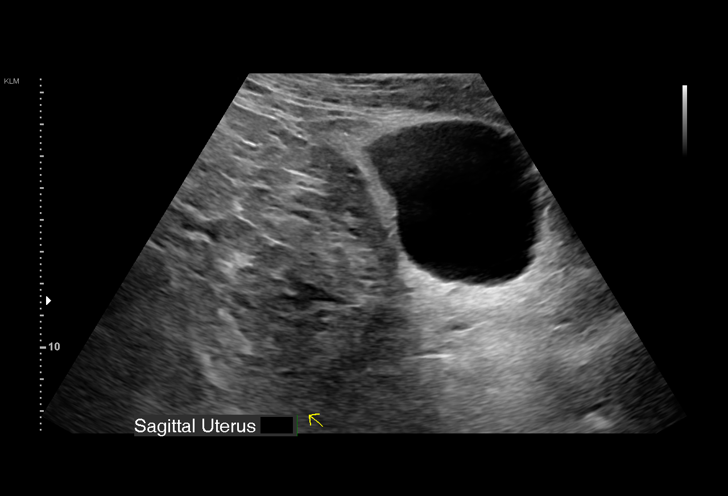
[im 6/37]
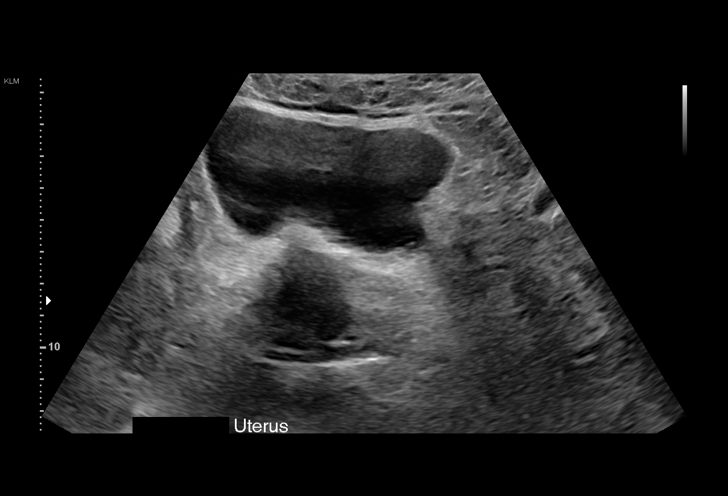
[im 9/37]
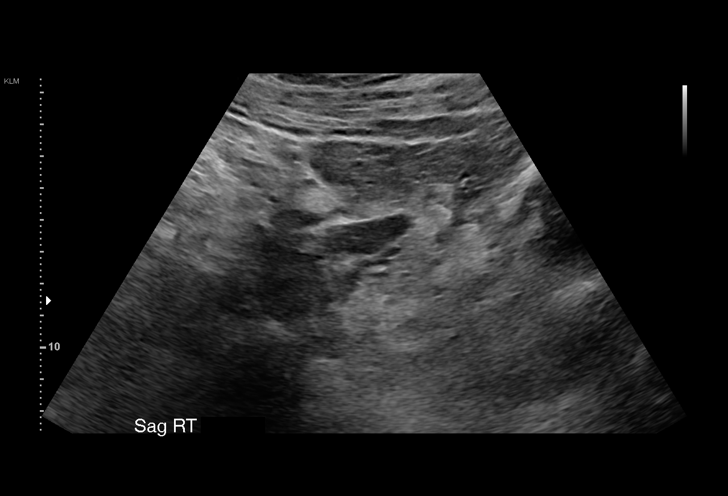
[im 11/37]
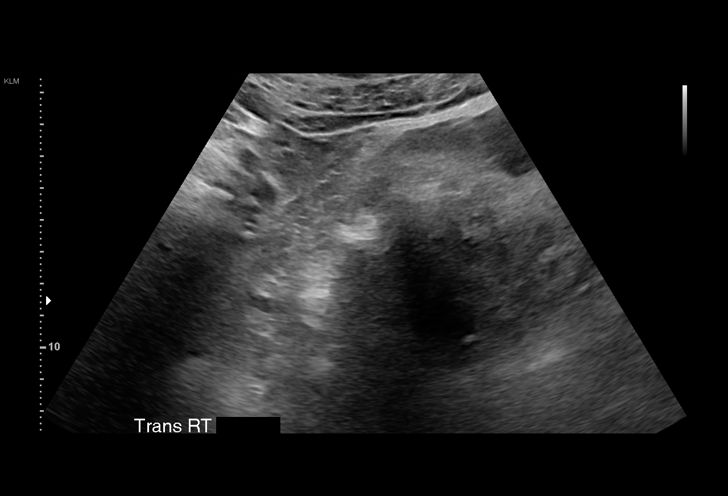
[im 14/37]
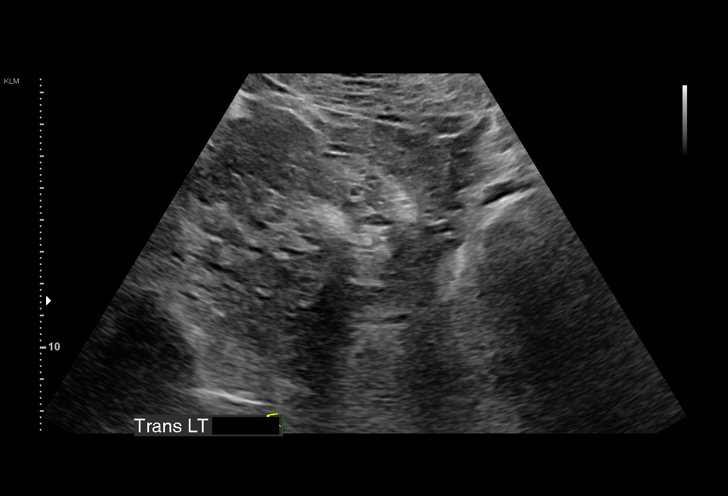
[im 17/37]
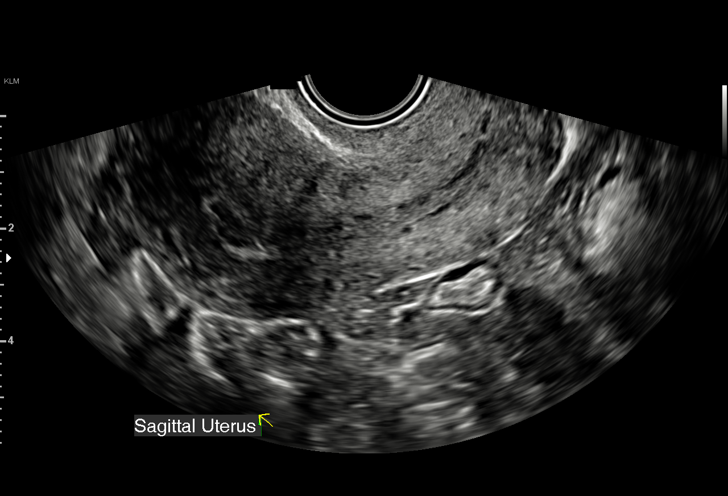
[im 19/37]
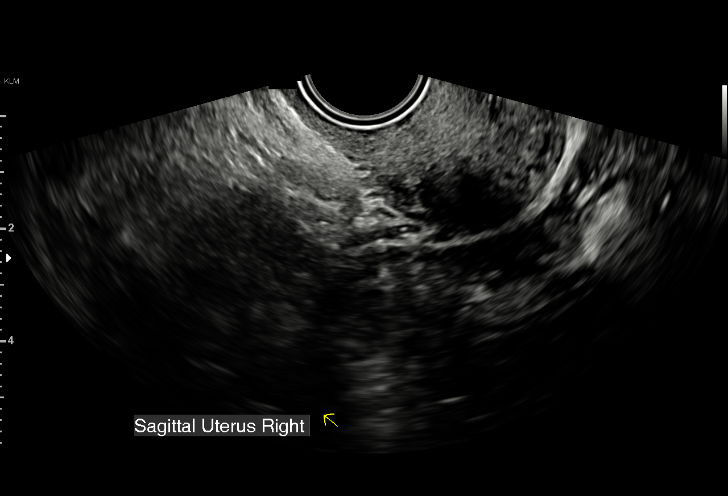
[im 21/37]
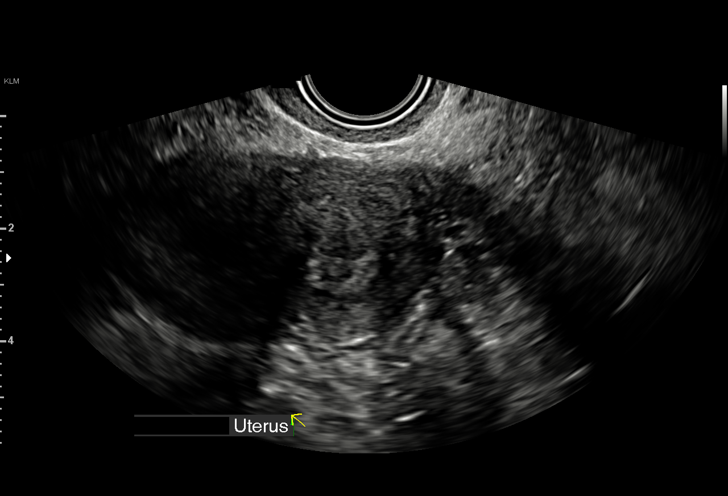
[im 23/37]
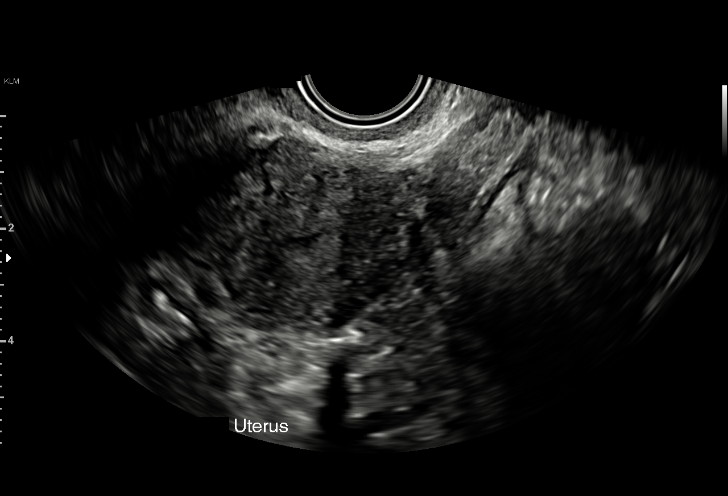
[im 26/37]
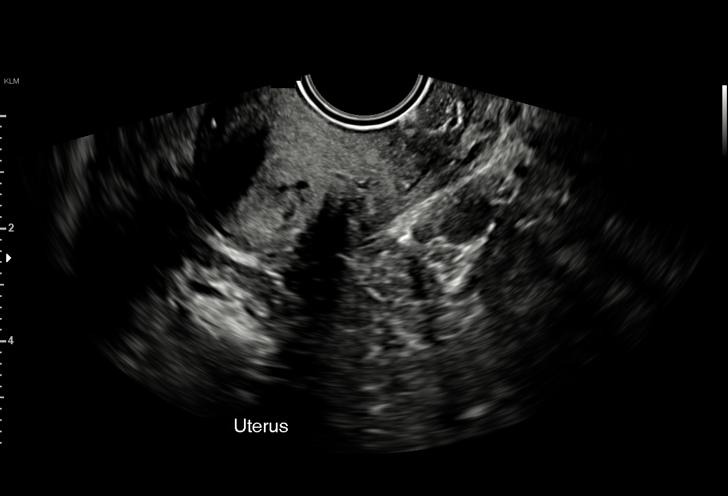
[im 29/37]
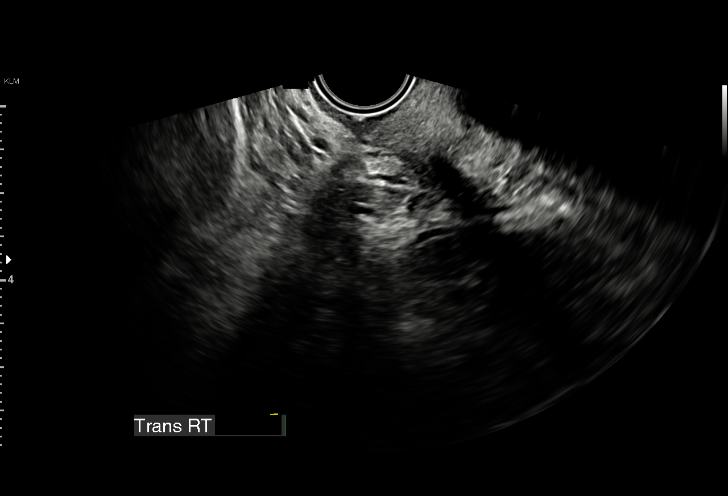
[im 31/37]
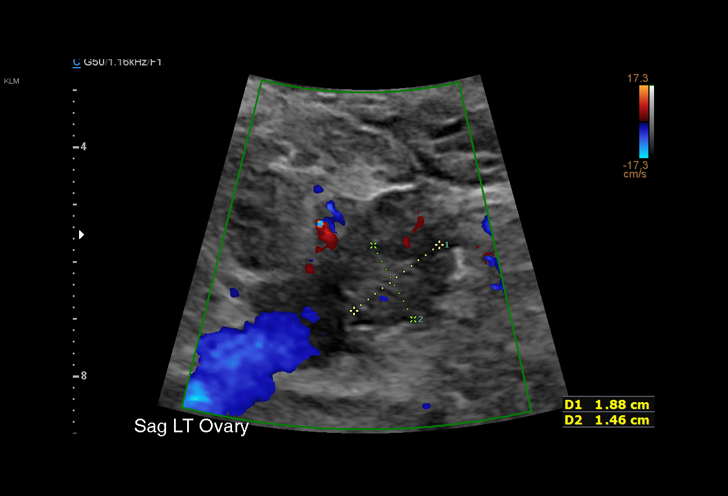
[im 34/37]
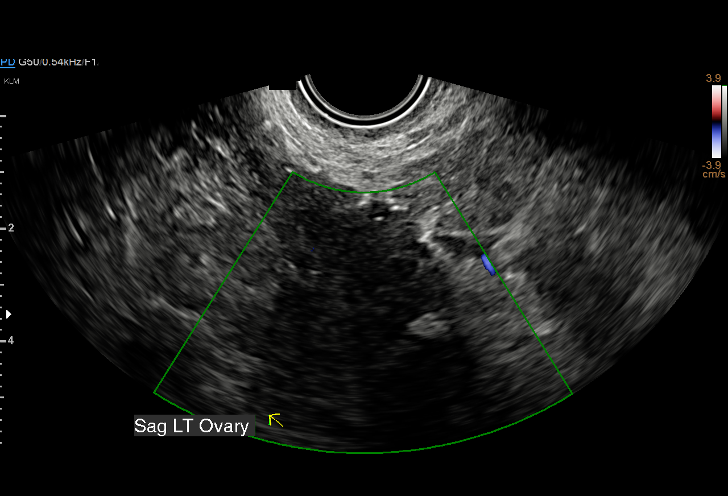
[im 37/37]
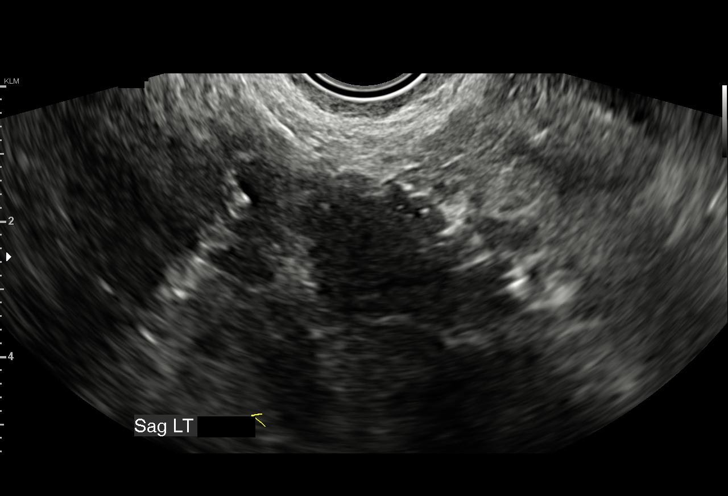

[15 of 28 positions shown; findings below may reference images not displayed]

FINDINGS: Intrauterine gestational sac: None

Yolk sac:  Not Visualized.

Embryo:  Not Visualized.

Cardiac Activity: Not Visualized.

Heart Rate:   bpm

MSD:   mm    w     d

CRL:    mm    w    d                  US EDC:

Subchorionic hemorrhage:  None visualized.

Maternal uterus/adnexae: Surgically absent right ovary. Left ovary
not visualized. No visible adnexal mass.
IMPRESSION: No intrauterine gestation. No visible adnexal mass. Recommend
continued follow-up with serial quantitative beta HCGs and
ultrasounds as indicated.

## 2022-02-15 ENCOUNTER — Other Ambulatory Visit: Payer: Self-pay | Admitting: Internal Medicine

## 2022-02-15 DIAGNOSIS — F988 Other specified behavioral and emotional disorders with onset usually occurring in childhood and adolescence: Secondary | ICD-10-CM

## 2022-02-15 MED ORDER — AMPHETAMINE-DEXTROAMPHETAMINE 15 MG PO TABS: 15.0000 mg | ORAL_TABLET | Freq: Two times a day (BID) | ORAL | 0 refills | Status: DC

## 2022-03-16 ENCOUNTER — Other Ambulatory Visit: Payer: Self-pay | Admitting: Internal Medicine

## 2022-03-16 DIAGNOSIS — F988 Other specified behavioral and emotional disorders with onset usually occurring in childhood and adolescence: Secondary | ICD-10-CM

## 2022-03-16 MED ORDER — AMPHETAMINE-DEXTROAMPHETAMINE 15 MG PO TABS: 15.0000 mg | ORAL_TABLET | Freq: Two times a day (BID) | ORAL | 0 refills | Status: DC

## 2022-03-31 ENCOUNTER — Encounter: Payer: Self-pay | Admitting: Internal Medicine

## 2022-03-31 NOTE — Patient Instructions (Addendum)
Blood work was ordered.   The lab is on the first floor.    Medications changes include :   none    Return in about 6 months (around 09/30/2022) for ADD.   Health Maintenance, Female Adopting a healthy lifestyle and getting preventive care are important in promoting health and wellness. Ask your health care provider about: The right schedule for you to have regular tests and exams. Things you can do on your own to prevent diseases and keep yourself healthy. What should I know about diet, weight, and exercise? Eat a healthy diet  Eat a diet that includes plenty of vegetables, fruits, low-fat dairy products, and lean protein. Do not eat a lot of foods that are high in solid fats, added sugars, or sodium. Maintain a healthy weight Body mass index (BMI) is used to identify weight problems. It estimates body fat based on height and weight. Your health care provider can help determine your BMI and help you achieve or maintain a healthy weight. Get regular exercise Get regular exercise. This is one of the most important things you can do for your health. Most adults should: Exercise for at least 150 minutes each week. The exercise should increase your heart rate and make you sweat (moderate-intensity exercise). Do strengthening exercises at least twice a week. This is in addition to the moderate-intensity exercise. Spend less time sitting. Even light physical activity can be beneficial. Watch cholesterol and blood lipids Have your blood tested for lipids and cholesterol at 39 years of age, then have this test every 5 years. Have your cholesterol levels checked more often if: Your lipid or cholesterol levels are high. You are older than 39 years of age. You are at high risk for heart disease. What should I know about cancer screening? Depending on your health history and family history, you may need to have cancer screening at various ages. This may include screening for: Breast  cancer. Cervical cancer. Colorectal cancer. Skin cancer. Lung cancer. What should I know about heart disease, diabetes, and high blood pressure? Blood pressure and heart disease High blood pressure causes heart disease and increases the risk of stroke. This is more likely to develop in people who have high blood pressure readings or are overweight. Have your blood pressure checked: Every 3-5 years if you are 59-41 years of age. Every year if you are 58 years old or older. Diabetes Have regular diabetes screenings. This checks your fasting blood sugar level. Have the screening done: Once every three years after age 65 if you are at a normal weight and have a low risk for diabetes. More often and at a younger age if you are overweight or have a high risk for diabetes. What should I know about preventing infection? Hepatitis B If you have a higher risk for hepatitis B, you should be screened for this virus. Talk with your health care provider to find out if you are at risk for hepatitis B infection. Hepatitis C Testing is recommended for: Everyone born from [birth year] through 1967. Anyone with known risk factors for hepatitis C. Sexually transmitted infections (STIs) Get screened for STIs, including gonorrhea and chlamydia, if: You are sexually active and are younger than 39 years of age. You are older than 39 years of age and your health care provider tells you that you are at risk for this type of infection. Your sexual activity has changed since you were last screened, and you are at increased risk  for chlamydia or gonorrhea. Ask your health care provider if you are at risk. Ask your health care provider about whether you are at high risk for HIV. Your health care provider may recommend a prescription medicine to help prevent HIV infection. If you choose to take medicine to prevent HIV, you should first get tested for HIV. You should then be tested every 3 months for as long as you are taking  the medicine. Pregnancy If you are about to stop having your period (premenopausal) and you may become pregnant, seek counseling before you get pregnant. Take 400 to 800 micrograms (mcg) of folic acid every day if you become pregnant. Ask for birth control (contraception) if you want to prevent pregnancy. Osteoporosis and menopause Osteoporosis is a disease in which the bones lose minerals and strength with aging. This can result in bone fractures. If you are 26 years old or older, or if you are at risk for osteoporosis and fractures, ask your health care provider if you should: Be screened for bone loss. Take a calcium or vitamin D supplement to lower your risk of fractures. Be given hormone replacement therapy (HRT) to treat symptoms of menopause. Follow these instructions at home: Alcohol use Do not drink alcohol if: Your health care provider tells you not to drink. You are pregnant, may be pregnant, or are planning to become pregnant. If you drink alcohol: Limit how much you have to: 0-1 drink a day. Know how much alcohol is in your drink. In the [place] one drink equals one 12 oz bottle of beer (355 mL), one 5 oz glass of wine (148 mL), or one 1 oz glass of hard liquor (44 mL). Lifestyle Do not use any products that contain nicotine or tobacco. These products include cigarettes, chewing tobacco, and vaping devices, such as e-cigarettes. If you need help quitting, ask your health care provider. Do not use street drugs. Do not share needles. Ask your health care provider for help if you need support or information about quitting drugs. General instructions Schedule regular health, dental, and eye exams. Stay current with your vaccines. Tell your health care provider if: You often feel depressed. You have ever been abused or do not feel safe at home. Summary Adopting a healthy lifestyle and getting preventive care are important in promoting health and wellness. Follow your health  care provider's instructions about healthy diet, exercising, and getting tested or screened for diseases. Follow your health care provider's instructions on monitoring your cholesterol and blood pressure. This information is not intended to replace advice given to you by your health care provider. Make sure you discuss any questions you have with your health care provider. Document Revised: 08/04/2020 Document Reviewed: 08/04/2020 Elsevier Patient Education  [employer].

## 2022-03-31 NOTE — Progress Notes (Unsigned)
    Subjective:    Patient ID: Adriana Hansen, female    DOB: 11-20-1985, 37 y.o.   MRN: [id number]      HPI Adriana Hansen is here for a Physical exam.      Medications and allergies reviewed with patient and updated if appropriate.  Current Outpatient Medications on File Prior to Visit  Medication Sig Dispense Refill   amphetamine-dextroamphetamine (ADDERALL) 15 MG tablet Take 1 tablet by mouth 2 (two) times daily. 60 tablet 0   No current facility-administered medications on file prior to visit.    Review of Systems     Objective:  There were no vitals filed for this visit. There were no vitals filed for this visit. There is no height or weight on file to calculate BMI.  BP Readings from Last 3 Encounters:  09/17/21 (!) 148/90  03/18/21 120/82  09/17/20 126/82    Wt Readings from Last 3 Encounters:  09/17/21 206 lb (93.4 kg)  03/18/21 197 lb (89.4 kg)  09/17/20 195 lb (88.5 kg)       Physical Exam Constitutional: She appears well-developed and well-nourished. No distress.  HENT:  Head: Normocephalic and atraumatic.  Right Ear: External ear normal. Normal ear canal and TM Left Ear: External ear normal.  Normal ear canal and TM Mouth/Throat: Oropharynx is clear and moist.  Eyes: Conjunctivae normal.  Neck: Neck supple. No tracheal deviation present. No thyromegaly present.  No carotid bruit  Cardiovascular: Normal rate, regular rhythm and normal heart sounds.   No murmur heard.  No edema. Pulmonary/Chest: Effort normal and breath sounds normal. No respiratory distress. She has no wheezes. She has no rales.  Breast: deferred   Abdominal: Soft. She exhibits no distension. There is no tenderness.  Lymphadenopathy: She has no cervical adenopathy.  Skin: Skin is warm and dry. She is not diaphoretic.  Psychiatric: She has a normal mood and affect. Her behavior is normal.     Lab Results  Component Value Date   WBC 11.4 (H) 09/17/2020   HGB 13.6 09/17/2020    HCT 39.6 09/17/2020   PLT 413.0 (H) 09/17/2020   GLUCOSE 86 09/17/2020   CHOL 99 09/17/2020   TRIG 71.0 09/17/2020   HDL 28.70 (L) 09/17/2020   LDLCALC 57 09/17/2020   ALT 13 09/17/2020   AST 12 09/17/2020   NA 139 09/17/2020   K 4.1 09/17/2020   CL 107 09/17/2020   CREATININE 0.66 09/17/2020   BUN 8 09/17/2020   CO2 24 09/17/2020   TSH 1.85 09/17/2020         Assessment & Plan:   Physical exam: Screening blood work  ordered Exercise   Weight   Substance abuse  none   Reviewed recommended immunizations.   Health Maintenance  Topic Date Due   INFLUENZA VACCINE  10/27/2021   COVID-19 Vaccine (3 - 2023-24 season) 11/27/2021   PAP SMEAR-Modifier  01/12/2023   DTaP/Tdap/Td (3 - Td or Tdap) 09/10/2029   Hepatitis C Screening  Completed   HIV Screening  Completed   HPV VACCINES  Aged Out          See Problem List for Assessment and Plan of chronic medical problems.

## 2022-04-01 ENCOUNTER — Ambulatory Visit (INDEPENDENT_AMBULATORY_CARE_PROVIDER_SITE_OTHER): Payer: BC Managed Care – PPO | Admitting: Internal Medicine

## 2022-04-01 VITALS — BP 128/70 | HR 96 | Temp 98.1°F | Ht 65.0 in | Wt 218.0 lb

## 2022-04-01 DIAGNOSIS — F17210 Nicotine dependence, cigarettes, uncomplicated: Secondary | ICD-10-CM

## 2022-04-01 DIAGNOSIS — F988 Other specified behavioral and emotional disorders with onset usually occurring in childhood and adolescence: Secondary | ICD-10-CM | POA: Diagnosis not present

## 2022-04-01 DIAGNOSIS — E669 Obesity, unspecified: Secondary | ICD-10-CM

## 2022-04-01 DIAGNOSIS — Z0000 Encounter for general adult medical examination without abnormal findings: Secondary | ICD-10-CM

## 2022-04-01 DIAGNOSIS — E66811 Obesity, class 1: Secondary | ICD-10-CM

## 2022-04-01 LAB — CBC WITH DIFFERENTIAL/PLATELET
Basophils Absolute: 0.1 10*3/uL (ref 0.0–0.1)
Basophils Relative: 0.6 % (ref 0.0–3.0)
Eosinophils Absolute: 0.1 10*3/uL (ref 0.0–0.7)
Eosinophils Relative: 1.1 % (ref 0.0–5.0)
HCT: 38.7 % (ref 36.0–46.0)
Hemoglobin: 13 g/dL (ref 12.0–15.0)
Lymphocytes Relative: 19.7 % (ref 12.0–46.0)
Lymphs Abs: 2.4 10*3/uL (ref 0.7–4.0)
MCHC: 33.7 g/dL (ref 30.0–36.0)
MCV: 84.7 fl (ref 78.0–100.0)
Monocytes Absolute: 1.2 10*3/uL — ABNORMAL HIGH (ref 0.1–1.0)
Monocytes Relative: 9.7 % (ref 3.0–12.0)
Neutro Abs: 8.5 10*3/uL — ABNORMAL HIGH (ref 1.4–7.7)
Neutrophils Relative %: 68.9 % (ref 43.0–77.0)
Platelets: 473 10*3/uL — ABNORMAL HIGH (ref 150.0–400.0)
RBC: 4.57 Mil/uL (ref 3.87–5.11)
RDW: 12.7 % (ref 11.5–15.5)
WBC: 12.4 10*3/uL — ABNORMAL HIGH (ref 4.0–10.5)

## 2022-04-01 LAB — TSH: TSH: 2.26 u[IU]/mL (ref 0.35–5.50)

## 2022-04-01 LAB — COMPREHENSIVE METABOLIC PANEL
ALT: 16 U/L (ref 0–35)
AST: 15 U/L (ref 0–37)
Albumin: 4.2 g/dL (ref 3.5–5.2)
Alkaline Phosphatase: 79 U/L (ref 39–117)
BUN: 10 mg/dL (ref 6–23)
CO2: 26 mEq/L (ref 19–32)
Calcium: 8.9 mg/dL (ref 8.4–10.5)
Chloride: 104 mEq/L (ref 96–112)
Creatinine, Ser: 0.62 mg/dL (ref 0.40–1.20)
GFR: 112.57 mL/min (ref >60.00)
Glucose, Bld: 88 mg/dL (ref 70–99)
Potassium: 4 mEq/L (ref 3.5–5.1)
Sodium: 136 mEq/L (ref 135–145)
Total Bilirubin: 0.6 mg/dL (ref 0.2–1.2)
Total Protein: 6.6 g/dL (ref 6.0–8.3)

## 2022-04-01 LAB — LIPID PANEL
Cholesterol: 145 mg/dL (ref 0–200)
HDL: 38.1 mg/dL — ABNORMAL LOW (ref >39.00)
LDL Cholesterol: 85 mg/dL (ref 0–99)
NonHDL: 107.17
Total CHOL/HDL Ratio: 4
Triglycerides: 111 mg/dL (ref 0.0–149.0)
VLDL: 22.2 mg/dL (ref 0.0–40.0)

## 2022-04-01 NOTE — Assessment & Plan Note (Addendum)
No longer smoking cigarettes - she is vaping  - plans on slowly cutting back on nicotine and hopes to stop altogether

## 2022-04-01 NOTE — Assessment & Plan Note (Addendum)
Chronic Encouraged weight loss - goal 5 days a week x 30 min Advised regular exercise Advised decreased portions, diet high in protein, vegetables and fiber

## 2022-04-01 NOTE — Assessment & Plan Note (Signed)
Chronic Controlled, Stable Continue Adderall 15 mg twice daily

## 2022-04-16 ENCOUNTER — Other Ambulatory Visit: Payer: Self-pay | Admitting: Internal Medicine

## 2022-04-16 DIAGNOSIS — F988 Other specified behavioral and emotional disorders with onset usually occurring in childhood and adolescence: Secondary | ICD-10-CM

## 2022-04-19 MED ORDER — AMPHETAMINE-DEXTROAMPHETAMINE 15 MG PO TABS: 15.0000 mg | ORAL_TABLET | Freq: Two times a day (BID) | ORAL | 0 refills | Status: DC

## 2022-05-18 ENCOUNTER — Other Ambulatory Visit: Payer: Self-pay | Admitting: Internal Medicine

## 2022-05-18 DIAGNOSIS — F988 Other specified behavioral and emotional disorders with onset usually occurring in childhood and adolescence: Secondary | ICD-10-CM

## 2022-05-19 MED ORDER — AMPHETAMINE-DEXTROAMPHETAMINE 15 MG PO TABS: 15.0000 mg | ORAL_TABLET | Freq: Two times a day (BID) | ORAL | 0 refills | Status: DC

## 2022-06-15 ENCOUNTER — Other Ambulatory Visit: Payer: Self-pay | Admitting: Internal Medicine

## 2022-06-15 DIAGNOSIS — F988 Other specified behavioral and emotional disorders with onset usually occurring in childhood and adolescence: Secondary | ICD-10-CM

## 2022-06-16 MED ORDER — AMPHETAMINE-DEXTROAMPHETAMINE 15 MG PO TABS: 15.0000 mg | ORAL_TABLET | Freq: Two times a day (BID) | ORAL | 0 refills | Status: DC

## 2022-07-15 ENCOUNTER — Other Ambulatory Visit: Payer: Self-pay | Admitting: Internal Medicine

## 2022-07-15 DIAGNOSIS — F988 Other specified behavioral and emotional disorders with onset usually occurring in childhood and adolescence: Secondary | ICD-10-CM

## 2022-07-15 MED ORDER — AMPHETAMINE-DEXTROAMPHETAMINE 15 MG PO TABS: 15.0000 mg | ORAL_TABLET | Freq: Two times a day (BID) | ORAL | 0 refills | Status: DC

## 2022-08-16 ENCOUNTER — Other Ambulatory Visit: Payer: Self-pay | Admitting: Internal Medicine

## 2022-08-16 DIAGNOSIS — F988 Other specified behavioral and emotional disorders with onset usually occurring in childhood and adolescence: Secondary | ICD-10-CM

## 2022-08-16 MED ORDER — AMPHETAMINE-DEXTROAMPHETAMINE 15 MG PO TABS: 15.0000 mg | ORAL_TABLET | Freq: Two times a day (BID) | ORAL | 0 refills | Status: DC

## 2022-09-15 ENCOUNTER — Other Ambulatory Visit: Payer: Self-pay | Admitting: Internal Medicine

## 2022-09-15 DIAGNOSIS — F988 Other specified behavioral and emotional disorders with onset usually occurring in childhood and adolescence: Secondary | ICD-10-CM

## 2022-09-17 MED ORDER — AMPHETAMINE-DEXTROAMPHETAMINE 15 MG PO TABS
15.0000 mg | ORAL_TABLET | Freq: Two times a day (BID) | ORAL | 0 refills | Status: DC
Start: 2022-09-17 — End: 2022-10-14

## 2022-10-05 ENCOUNTER — Encounter: Payer: Self-pay | Admitting: Internal Medicine

## 2022-10-05 NOTE — Patient Instructions (Addendum)
       Medications changes include :   none      Return in about 6 months (around 04/08/2023) for Physical Exam.

## 2022-10-05 NOTE — Progress Notes (Unsigned)
      Subjective:    Patient ID: Adriana Hansen, female    DOB: 02/06/1986, 37 y.o.   MRN: [id number]     HPI Adriana Hansen is here for follow up of her chronic medical problems.  Doing well-no concerns.  Medications and allergies reviewed with patient and updated if appropriate.  Current Outpatient Medications on File Prior to Visit  Medication Sig Dispense Refill   amphetamine-dextroamphetamine (ADDERALL) 15 MG tablet Take 1 tablet by mouth 2 (two) times daily. 60 tablet 0   Multiple Vitamin (MULTIVITAMIN) capsule Take 1 capsule by mouth daily.     No current facility-administered medications on file prior to visit.     Review of Systems  Constitutional:  Negative for appetite change and unexpected weight change.  Respiratory:  Negative for shortness of breath.   Cardiovascular:  Negative for chest pain and palpitations.  Neurological:  Negative for light-headedness and headaches.       Objective:   Vitals:   10/06/22 0829  BP: 134/80  Pulse: 92  Temp: 98.3 F (36.8 C)  SpO2: 98%   BP Readings from Last 3 Encounters:  10/06/22 134/80  04/01/22 128/70  09/17/21 (!) 148/90   Wt Readings from Last 3 Encounters:  10/06/22 217 lb (98.4 kg)  04/01/22 218 lb (98.9 kg)  09/17/21 206 lb (93.4 kg)   Body mass index is 36.11 kg/m.    Physical Exam Constitutional:      General: She is not in acute distress.    Appearance: Normal appearance. She is not ill-appearing.  HENT:     Head: Normocephalic and atraumatic.  Skin:    General: Skin is warm and dry.  Neurological:     Mental Status: She is alert. Mental status is at baseline.  Psychiatric:        Mood and Affect: Mood normal.        Behavior: Behavior normal.        Thought Content: Thought content normal.        Judgment: Judgment normal.        Lab Results  Component Value Date   WBC 12.4 (H) 04/01/2022   HGB 13.0 04/01/2022   HCT 38.7 04/01/2022   PLT 473.0 (H) 04/01/2022   GLUCOSE 88 04/01/2022    CHOL 145 04/01/2022   TRIG 111.0 04/01/2022   HDL 38.10 (L) 04/01/2022   LDLCALC 85 04/01/2022   ALT 16 04/01/2022   AST 15 04/01/2022   NA 136 04/01/2022   K 4.0 04/01/2022   CL 104 04/01/2022   CREATININE 0.62 04/01/2022   BUN 10 04/01/2022   CO2 26 04/01/2022   TSH 2.26 04/01/2022     Assessment & Plan:    See Problem List for Assessment and Plan of chronic medical problems.

## 2022-10-06 ENCOUNTER — Ambulatory Visit: Payer: BC Managed Care – PPO | Admitting: Internal Medicine

## 2022-10-06 VITALS — BP 134/80 | HR 92 | Temp 98.3°F | Ht 65.0 in | Wt 217.0 lb

## 2022-10-06 DIAGNOSIS — F988 Other specified behavioral and emotional disorders with onset usually occurring in childhood and adolescence: Secondary | ICD-10-CM

## 2022-10-06 NOTE — Assessment & Plan Note (Signed)
Chronic Controlled, Stable Continue Adderall 15 mg twice daily 

## 2022-10-08 ENCOUNTER — Ambulatory Visit: Payer: BC Managed Care – PPO | Admitting: Internal Medicine

## 2022-10-14 ENCOUNTER — Other Ambulatory Visit: Payer: Self-pay | Admitting: Internal Medicine

## 2022-10-14 DIAGNOSIS — F988 Other specified behavioral and emotional disorders with onset usually occurring in childhood and adolescence: Secondary | ICD-10-CM

## 2022-10-14 MED ORDER — AMPHETAMINE-DEXTROAMPHETAMINE 15 MG PO TABS
15.0000 mg | ORAL_TABLET | Freq: Two times a day (BID) | ORAL | 0 refills | Status: DC
Start: 2022-10-14 — End: 2022-11-17

## 2022-11-16 ENCOUNTER — Encounter: Payer: Self-pay | Admitting: Internal Medicine

## 2022-11-16 DIAGNOSIS — F988 Other specified behavioral and emotional disorders with onset usually occurring in childhood and adolescence: Secondary | ICD-10-CM

## 2022-11-17 MED ORDER — AMPHETAMINE-DEXTROAMPHETAMINE 15 MG PO TABS
15.0000 mg | ORAL_TABLET | Freq: Two times a day (BID) | ORAL | 0 refills | Status: DC
Start: 2022-11-17 — End: 2022-12-16

## 2022-12-16 ENCOUNTER — Other Ambulatory Visit: Payer: Self-pay | Admitting: Internal Medicine

## 2022-12-16 DIAGNOSIS — F988 Other specified behavioral and emotional disorders with onset usually occurring in childhood and adolescence: Secondary | ICD-10-CM

## 2022-12-19 MED ORDER — AMPHETAMINE-DEXTROAMPHETAMINE 15 MG PO TABS
15.0000 mg | ORAL_TABLET | Freq: Two times a day (BID) | ORAL | 0 refills | Status: DC
Start: 2022-12-19 — End: 2023-01-17

## 2023-01-17 ENCOUNTER — Other Ambulatory Visit: Payer: Self-pay | Admitting: Internal Medicine

## 2023-01-17 DIAGNOSIS — F988 Other specified behavioral and emotional disorders with onset usually occurring in childhood and adolescence: Secondary | ICD-10-CM

## 2023-01-18 MED ORDER — AMPHETAMINE-DEXTROAMPHETAMINE 15 MG PO TABS
15.0000 mg | ORAL_TABLET | Freq: Two times a day (BID) | ORAL | 0 refills | Status: DC
Start: 2023-01-18 — End: 2023-01-19

## 2023-01-19 ENCOUNTER — Telehealth: Payer: Self-pay | Admitting: Internal Medicine

## 2023-01-19 DIAGNOSIS — F988 Other specified behavioral and emotional disorders with onset usually occurring in childhood and adolescence: Secondary | ICD-10-CM

## 2023-01-19 MED ORDER — AMPHETAMINE-DEXTROAMPHETAMINE 15 MG PO TABS
15.0000 mg | ORAL_TABLET | Freq: Two times a day (BID) | ORAL | 0 refills | Status: DC
Start: 2023-01-19 — End: 2023-02-17

## 2023-01-19 NOTE — Telephone Encounter (Signed)
Prescription Request  01/19/2023  LOV: 10/06/2022  What is the name of the medication or equipment? amphetamine-dextroamphetamine (ADDERALL) 15 MG tablet   Have you contacted your pharmacy to request a refill? No   Which pharmacy would you like this sent to?  [facility] [address], [place], [place] [address] Hours:  Open ? Closes 11?PM Pickup: Now ? Ends 8?PM  More hours Phone: [phone]  Patient notified that their request is being sent to the clinical staff for review and that they should receive a response within 2 business days.   Please advise at Mobile [phone] (mobile)   Karin Golden was out of mediation so can we send it to this location.

## 2023-01-19 NOTE — Telephone Encounter (Signed)
Please disregard per patient

## 2023-02-17 ENCOUNTER — Telehealth: Payer: Self-pay | Admitting: Internal Medicine

## 2023-02-17 DIAGNOSIS — F988 Other specified behavioral and emotional disorders with onset usually occurring in childhood and adolescence: Secondary | ICD-10-CM

## 2023-02-17 MED ORDER — AMPHETAMINE-DEXTROAMPHETAMINE 15 MG PO TABS
15.0000 mg | ORAL_TABLET | Freq: Two times a day (BID) | ORAL | 0 refills | Status: DC
Start: 2023-02-17 — End: 2023-03-17

## 2023-02-17 NOTE — Telephone Encounter (Signed)
Prescription Request  02/17/2023  LOV: 10/06/2022  What is the name of the medication or equipment? amphetamine-dextroamphetamine (ADDERALL) 15 MG tablet  Have you contacted your pharmacy to request a refill? No   Which pharmacy would you like this sent to?   [facility] [id number] [place], [place] - [address] [facility] [address] [place] [address] Phone: [phone] Fax: [phone]  Patient notified that their request is being sent to the clinical staff for review and that they should receive a response within 2 business days.   Please advise at Mobile [phone] (mobile)

## 2023-03-17 ENCOUNTER — Other Ambulatory Visit: Payer: Self-pay | Admitting: Internal Medicine

## 2023-03-17 DIAGNOSIS — F988 Other specified behavioral and emotional disorders with onset usually occurring in childhood and adolescence: Secondary | ICD-10-CM

## 2023-03-17 MED ORDER — AMPHETAMINE-DEXTROAMPHETAMINE 15 MG PO TABS
15.0000 mg | ORAL_TABLET | Freq: Two times a day (BID) | ORAL | 0 refills | Status: DC
Start: 2023-03-17 — End: 2023-04-21

## 2023-04-07 ENCOUNTER — Encounter: Payer: Self-pay | Admitting: Internal Medicine

## 2023-04-07 DIAGNOSIS — D72829 Elevated white blood cell count, unspecified: Secondary | ICD-10-CM | POA: Insufficient documentation

## 2023-04-07 NOTE — Progress Notes (Signed)
 Subjective:    Patient ID: Adriana Hansen, female    DOB: 14-May-1985, 38 y.o.   MRN: [id number]      HPI [name] is here for a Physical exam and her chronic medical problems.    Doing well - no concerns.   Medications and allergies reviewed with patient and updated if appropriate.  Current Outpatient Medications on File Prior to Visit  Medication Sig Dispense Refill   amphetamine -dextroamphetamine  (ADDERALL) 15 MG tablet Take 1 tablet by mouth 2 (two) times daily. 60 tablet 0   Multiple Vitamin (MULTIVITAMIN) capsule Take 1 capsule by mouth daily.     No current facility-administered medications on file prior to visit.    Review of Systems  Constitutional:  Negative for fever.  Eyes:  Negative for visual disturbance.  Respiratory:  Negative for cough, shortness of breath and wheezing.   Cardiovascular:  Negative for chest pain, palpitations and leg swelling.  Gastrointestinal:  Negative for abdominal pain, blood in stool, constipation and diarrhea.       No gerd  Genitourinary:  Negative for dysuria.  Musculoskeletal:  Negative for arthralgias and back pain.  Skin:  Negative for rash.  Neurological:  Negative for dizziness, light-headedness, numbness and headaches.  Psychiatric/Behavioral:  Negative for dysphoric mood. The patient is not nervous/anxious.        Objective:   Vitals:   04/08/23 0854  BP: 126/74  Pulse: 80  Temp: 98.6 F (37 C)  SpO2: 96%   Filed Weights   04/08/23 0854  Weight: 211 lb (95.7 kg)   Body mass index is 35.11 kg/m.  BP Readings from Last 3 Encounters:  04/08/23 126/74  10/06/22 134/80  04/01/22 128/70    Wt Readings from Last 3 Encounters:  04/08/23 211 lb (95.7 kg)  10/06/22 217 lb (98.4 kg)  04/01/22 218 lb (98.9 kg)       Physical Exam Constitutional: She appears well-developed and well-nourished. No distress.  HENT:  Head: Normocephalic and atraumatic.  Right Ear: External ear normal. Normal ear canal and  TM Left Ear: External ear normal.  Normal ear canal and TM Mouth/Throat: Oropharynx is clear and moist.  Eyes: Conjunctivae normal.  Neck: Neck supple. No tracheal deviation present. No thyromegaly present.  No carotid bruit  Cardiovascular: Normal rate, regular rhythm and normal heart sounds.   No murmur heard.  No edema. Pulmonary/Chest: Effort normal and breath sounds normal. No respiratory distress. She has no wheezes. She has no rales.  Breast: deferred   Abdominal: Soft. She exhibits no distension. There is no tenderness.  Lymphadenopathy: She has no cervical adenopathy.  Skin: Skin is warm and dry. She is not diaphoretic.  Psychiatric: She has a normal mood and affect. Her behavior is normal.     Lab Results  Component Value Date   WBC 12.4 (H) 04/01/2022   HGB 13.0 04/01/2022   HCT 38.7 04/01/2022   PLT 473.0 (H) 04/01/2022   GLUCOSE 88 04/01/2022   CHOL 145 04/01/2022   TRIG 111.0 04/01/2022   HDL 38.10 (L) 04/01/2022   LDLCALC 85 04/01/2022   ALT 16 04/01/2022   AST 15 04/01/2022   NA 136 04/01/2022   K 4.0 04/01/2022   CL 104 04/01/2022   CREATININE 0.62 04/01/2022   BUN 10 04/01/2022   CO2 26 04/01/2022   TSH 2.26 04/01/2022         Assessment & Plan:   Physical exam: Screening blood work  ordered Exercise  minimal -  stressed regular exercise Weight  - encouraged weight loss -  Substance abuse   - vapes - stressed cessation   Reviewed recommended immunizations.   Health Maintenance  Topic Date Due   Cervical Cancer Screening (HPV/Pap Cotest)  01/06/2021   COVID-19 Vaccine (3 - 2024-25 season) 04/24/2023 (Originally 11/28/2022)   INFLUENZA VACCINE  06/27/2023 (Originally 10/28/2022)   Pneumococcal Vaccine 48-100 Years old (1 of 2 - PCV) 04/07/2024 (Originally 04/27/1989)   DTaP/Tdap/Td (3 - Td or Tdap) 09/10/2029   Hepatitis C Screening  Completed   HIV Screening  Completed   HPV VACCINES  Aged Out          See Problem List for Assessment  and Plan of chronic medical problems.

## 2023-04-07 NOTE — Patient Instructions (Addendum)
 Blood work was ordered.       Medications changes include :   None     Return in about 6 months (around 10/06/2023) for follow up.    Health Maintenance, Female Adopting a healthy lifestyle and getting preventive care are important in promoting health and wellness. Ask your health care provider about: The right schedule for you to have regular tests and exams. Things you can do on your own to prevent diseases and keep yourself healthy. What should I know about diet, weight, and exercise? Eat a healthy diet  Eat a diet that includes plenty of vegetables, fruits, low-fat dairy products, and lean protein. Do not eat a lot of foods that are high in solid fats, added sugars, or sodium. Maintain a healthy weight Body mass index (BMI) is used to identify weight problems. It estimates body fat based on height and weight. Your health care provider can help determine your BMI and help you achieve or maintain a healthy weight. Get regular exercise Get regular exercise. This is one of the most important things you can do for your health. Most adults should: Exercise for at least 150 minutes each week. The exercise should increase your heart rate and make you sweat (moderate-intensity exercise). Do strengthening exercises at least twice a week. This is in addition to the moderate-intensity exercise. Spend less time sitting. Even light physical activity can be beneficial. Watch cholesterol and blood lipids Have your blood tested for lipids and cholesterol at 40 years of age, then have this test every 5 years. Have your cholesterol levels checked more often if: Your lipid or cholesterol levels are high. You are older than 40 years of age. You are at high risk for heart disease. What should I know about cancer screening? Depending on your health history and family history, you may need to have cancer screening at various ages. This may include screening for: Breast cancer. Cervical  cancer. Colorectal cancer. Skin cancer. Lung cancer. What should I know about heart disease, diabetes, and high blood pressure? Blood pressure and heart disease High blood pressure causes heart disease and increases the risk of stroke. This is more likely to develop in people who have high blood pressure readings or are overweight. Have your blood pressure checked: Every 3-5 years if you are 40-40 years of age. Every year if you are 40 years old or older. Diabetes Have regular diabetes screenings. This checks your fasting blood sugar level. Have the screening done: Once every three years after age 40 if you are at a normal weight and have a low risk for diabetes. More often and at a younger age if you are overweight or have a high risk for diabetes. What should I know about preventing infection? Hepatitis B If you have a higher risk for hepatitis B, you should be screened for this virus. Talk with your health care provider to find out if you are at risk for hepatitis B infection. Hepatitis C Testing is recommended for: Everyone born from [birth year] through 1965. Anyone with known risk factors for hepatitis C. Sexually transmitted infections (STIs) Get screened for STIs, including gonorrhea and chlamydia, if: You are sexually active and are younger than 40 years of age. You are older than 40 years of age and your health care provider tells you that you are at risk for this type of infection. Your sexual activity has changed since you were last screened, and you are at increased risk for chlamydia or  gonorrhea. Ask your health care provider if you are at risk. Ask your health care provider about whether you are at high risk for HIV. Your health care provider may recommend a prescription medicine to help prevent HIV infection. If you choose to take medicine to prevent HIV, you should first get tested for HIV. You should then be tested every 3 months for as long as you are taking the  medicine. Pregnancy If you are about to stop having your period (premenopausal) and you may become pregnant, seek counseling before you get pregnant. Take 400 to 800 micrograms (mcg) of folic acid every day if you become pregnant. Ask for birth control (contraception) if you want to prevent pregnancy. Osteoporosis and menopause Osteoporosis is a disease in which the bones lose minerals and strength with aging. This can result in bone fractures. If you are 40 years old or older, or if you are at risk for osteoporosis and fractures, ask your health care provider if you should: Be screened for bone loss. Take a calcium or vitamin D supplement to lower your risk of fractures. Be given hormone replacement therapy (HRT) to treat symptoms of menopause. Follow these instructions at home: Alcohol use Do not drink alcohol if: Your health care provider tells you not to drink. You are pregnant, may be pregnant, or are planning to become pregnant. If you drink alcohol: Limit how much you have to: 0-1 drink a day. Know how much alcohol is in your drink. In the [place] one drink equals one 12 oz bottle of beer (355 mL), one 5 oz glass of wine (148 mL), or one 1 oz glass of hard liquor (44 mL). Lifestyle Do not use any products that contain nicotine or tobacco. These products include cigarettes, chewing tobacco, and vaping devices, such as e-cigarettes. If you need help quitting, ask your health care provider. Do not use street drugs. Do not share needles. Ask your health care provider for help if you need support or information about quitting drugs. General instructions Schedule regular health, dental, and eye exams. Stay current with your vaccines. Tell your health care provider if: You often feel depressed. You have ever been abused or do not feel safe at home. Summary Adopting a healthy lifestyle and getting preventive care are important in promoting health and wellness. Follow your health care  provider's instructions about healthy diet, exercising, and getting tested or screened for diseases. Follow your health care provider's instructions on monitoring your cholesterol and blood pressure. This information is not intended to replace advice given to you by your health care provider. Make sure you discuss any questions you have with your health care provider. Document Revised: 08/04/2020 Document Reviewed: 08/04/2020 Elsevier Patient Education  2024 [employer].

## 2023-04-08 ENCOUNTER — Ambulatory Visit (INDEPENDENT_AMBULATORY_CARE_PROVIDER_SITE_OTHER): Payer: BC Managed Care – PPO | Admitting: Internal Medicine

## 2023-04-08 VITALS — BP 126/74 | HR 80 | Temp 98.6°F | Ht 65.0 in | Wt 211.0 lb

## 2023-04-08 DIAGNOSIS — Z0000 Encounter for general adult medical examination without abnormal findings: Secondary | ICD-10-CM

## 2023-04-08 DIAGNOSIS — R41840 Attention and concentration deficit: Secondary | ICD-10-CM | POA: Diagnosis not present

## 2023-04-08 DIAGNOSIS — E669 Obesity, unspecified: Secondary | ICD-10-CM

## 2023-04-08 DIAGNOSIS — D72829 Elevated white blood cell count, unspecified: Secondary | ICD-10-CM

## 2023-04-08 DIAGNOSIS — F17210 Nicotine dependence, cigarettes, uncomplicated: Secondary | ICD-10-CM

## 2023-04-08 DIAGNOSIS — E66811 Obesity, class 1: Secondary | ICD-10-CM

## 2023-04-08 LAB — LIPID PANEL
Cholesterol: 189 mg/dL (ref 0–200)
HDL: 40.7 mg/dL (ref >39.00)
LDL Cholesterol: 129 mg/dL — ABNORMAL HIGH (ref 0–99)
NonHDL: 148.67
Total CHOL/HDL Ratio: 5
Triglycerides: 96 mg/dL (ref 0.0–149.0)
VLDL: 19.2 mg/dL (ref 0.0–40.0)

## 2023-04-08 LAB — CBC WITH DIFFERENTIAL/PLATELET
Basophils Absolute: 0 10*3/uL (ref 0.0–0.1)
Basophils Relative: 0.4 % (ref 0.0–3.0)
Eosinophils Absolute: 0.1 10*3/uL (ref 0.0–0.7)
Eosinophils Relative: 0.8 % (ref 0.0–5.0)
HCT: 40.6 % (ref 36.0–46.0)
Hemoglobin: 13.6 g/dL (ref 12.0–15.0)
Lymphocytes Relative: 21.8 % (ref 12.0–46.0)
Lymphs Abs: 2.1 10*3/uL (ref 0.7–4.0)
MCHC: 33.5 g/dL (ref 30.0–36.0)
MCV: 85.8 fL (ref 78.0–100.0)
Monocytes Absolute: 1 10*3/uL (ref 0.1–1.0)
Monocytes Relative: 10.4 % (ref 3.0–12.0)
Neutro Abs: 6.3 10*3/uL (ref 1.4–7.7)
Neutrophils Relative %: 66.6 % (ref 43.0–77.0)
Platelets: 502 10*3/uL — ABNORMAL HIGH (ref 150.0–400.0)
RBC: 4.73 Mil/uL (ref 3.87–5.11)
RDW: 13.3 % (ref 11.5–15.5)
WBC: 9.4 10*3/uL (ref 4.0–10.5)

## 2023-04-08 LAB — COMPREHENSIVE METABOLIC PANEL
ALT: 12 U/L (ref 0–35)
AST: 14 U/L (ref 0–37)
Albumin: 4.8 g/dL (ref 3.5–5.2)
Alkaline Phosphatase: 98 U/L (ref 39–117)
BUN: 13 mg/dL (ref 6–23)
CO2: 28 meq/L (ref 19–32)
Calcium: 9.3 mg/dL (ref 8.4–10.5)
Chloride: 103 meq/L (ref 96–112)
Creatinine, Ser: 0.72 mg/dL (ref 0.40–1.20)
GFR: 104.94 mL/min (ref >60.00)
Glucose, Bld: 84 mg/dL (ref 70–99)
Potassium: 4.6 meq/L (ref 3.5–5.1)
Sodium: 140 meq/L (ref 135–145)
Total Bilirubin: 1 mg/dL (ref 0.2–1.2)
Total Protein: 7.5 g/dL (ref 6.0–8.3)

## 2023-04-08 LAB — TSH: TSH: 2.29 u[IU]/mL (ref 0.35–5.50)

## 2023-04-08 NOTE — Assessment & Plan Note (Addendum)
 Chronic Controlled, Stable Sometimes medication does not seem to work as well as others-discussed this could be because it is a generic and changes month-to-month-can discuss with the pharmacy to see about getting some consistency in which she gets Continue Adderall 15 mg twice daily

## 2023-04-08 NOTE — Assessment & Plan Note (Signed)
Chronic Encouraged weight loss - goal 5 days a week x 30 min Advised regular exercise Advised decreased portions, diet high in protein, vegetables and fiber

## 2023-04-08 NOTE — Assessment & Plan Note (Signed)
Chronic Mild CBC, CMP

## 2023-04-08 NOTE — Assessment & Plan Note (Signed)
 No longer smoking cigarettes - she is vaping   Stressed cessation-discussed potential damage to lungs and increased risk of cancer

## 2023-04-09 ENCOUNTER — Encounter: Payer: Self-pay | Admitting: Internal Medicine

## 2023-04-09 DIAGNOSIS — D75839 Thrombocytosis, unspecified: Secondary | ICD-10-CM | POA: Insufficient documentation

## 2023-04-21 ENCOUNTER — Other Ambulatory Visit: Payer: Self-pay | Admitting: Internal Medicine

## 2023-04-21 DIAGNOSIS — F988 Other specified behavioral and emotional disorders with onset usually occurring in childhood and adolescence: Secondary | ICD-10-CM

## 2023-04-21 MED ORDER — AMPHETAMINE-DEXTROAMPHETAMINE 15 MG PO TABS: 15.0000 mg | ORAL_TABLET | Freq: Two times a day (BID) | ORAL | 0 refills | Status: DC

## 2023-05-22 ENCOUNTER — Other Ambulatory Visit: Payer: Self-pay | Admitting: Internal Medicine

## 2023-05-22 DIAGNOSIS — F988 Other specified behavioral and emotional disorders with onset usually occurring in childhood and adolescence: Secondary | ICD-10-CM

## 2023-05-23 MED ORDER — AMPHETAMINE-DEXTROAMPHETAMINE 15 MG PO TABS: 15.0000 mg | ORAL_TABLET | Freq: Two times a day (BID) | ORAL | 0 refills | Status: DC

## 2023-06-20 ENCOUNTER — Other Ambulatory Visit: Payer: Self-pay | Admitting: Internal Medicine

## 2023-06-20 DIAGNOSIS — F988 Other specified behavioral and emotional disorders with onset usually occurring in childhood and adolescence: Secondary | ICD-10-CM

## 2023-06-22 NOTE — Telephone Encounter (Signed)
 LOV: 04/08/23 Last fill: 05/23/23,  60 tablet 0 refill

## 2023-06-23 MED ORDER — AMPHETAMINE-DEXTROAMPHETAMINE 15 MG PO TABS: 15.0000 mg | ORAL_TABLET | Freq: Two times a day (BID) | ORAL | 0 refills | Status: DC

## 2023-07-21 ENCOUNTER — Other Ambulatory Visit: Payer: Self-pay | Admitting: Internal Medicine

## 2023-07-21 DIAGNOSIS — F988 Other specified behavioral and emotional disorders with onset usually occurring in childhood and adolescence: Secondary | ICD-10-CM

## 2023-07-23 MED ORDER — AMPHETAMINE-DEXTROAMPHETAMINE 15 MG PO TABS: 15.0000 mg | ORAL_TABLET | Freq: Two times a day (BID) | ORAL | 0 refills | Status: DC

## 2023-08-24 ENCOUNTER — Other Ambulatory Visit: Payer: Self-pay | Admitting: Internal Medicine

## 2023-08-24 DIAGNOSIS — F988 Other specified behavioral and emotional disorders with onset usually occurring in childhood and adolescence: Secondary | ICD-10-CM

## 2023-08-24 NOTE — Telephone Encounter (Unsigned)
 Copied from CRM [id number]. Topic: Clinical - Medication Refill >> Aug 24, 2023 11:29 AM Dimple Francis wrote: Medication: amphetamine -dextroamphetamine  (ADDERALL) 15 MG tablet  Has the patient contacted their pharmacy? Yes (Agent: If no, request that the patient contact the pharmacy for the refill. If patient does not wish to contact the pharmacy document the reason why and proceed with request.) (Agent: If yes, when and what did the pharmacy advise?)  This is the patient's preferred pharmacy:  [facility] [id number] - [place], [place] - [address] [place] [address] Phone: [phone] Fax: [phone]    Is this the correct pharmacy for this prescription? Yes If no, delete pharmacy and type the correct one.   Has the prescription been filled recently? Yes  Is the patient out of the medication? Yes  Has the patient been seen for an appointment in the last year OR does the patient have an upcoming appointment? Yes  Can we respond through MyChart? Yes  Agent: Please be advised that Rx refills may take up to 3 business days. We ask that you follow-up with your pharmacy.

## 2023-08-25 MED ORDER — AMPHETAMINE-DEXTROAMPHETAMINE 15 MG PO TABS
15.0000 mg | ORAL_TABLET | Freq: Two times a day (BID) | ORAL | 0 refills | Status: DC
Start: 2023-08-25 — End: 2023-09-26

## 2023-09-26 ENCOUNTER — Other Ambulatory Visit: Payer: Self-pay | Admitting: Internal Medicine

## 2023-09-26 DIAGNOSIS — F988 Other specified behavioral and emotional disorders with onset usually occurring in childhood and adolescence: Secondary | ICD-10-CM

## 2023-09-26 NOTE — Telephone Encounter (Unsigned)
 Copied from CRM [id number]. Topic: Clinical - Medication Refill >> Sep 26, 2023  4:34 PM Kevelyn M wrote: Medication: amphetamine -dextroamphetamine  (ADDERALL) 15 MG tablet  Has the patient contacted their pharmacy? Yes (Agent: If no, request that the patient contact the pharmacy for the refill. If patient does not wish to contact the pharmacy document the reason why and proceed with request.) (Agent: If yes, when and what did the pharmacy advise?)  This is the patient's preferred pharmacy:  [facility] [id number] - [place], [place] - [address] [place] [address] Phone: [phone] Fax: [phone]  Is this the correct pharmacy for this prescription? Yes If no, delete pharmacy and type the correct one.   Has the prescription been filled recently? No  Is the patient out of the medication? No, one left  Has the patient been seen for an appointment in the last year OR does the patient have an upcoming appointment? Yes  Can we respond through MyChart? Yes  Agent: Please be advised that Rx refills may take up to 3 business days. We ask that you follow-up with your pharmacy.

## 2023-09-27 MED ORDER — AMPHETAMINE-DEXTROAMPHETAMINE 15 MG PO TABS
15.0000 mg | ORAL_TABLET | Freq: Two times a day (BID) | ORAL | 0 refills | Status: DC
Start: 2023-09-27 — End: 2023-10-27

## 2023-10-06 ENCOUNTER — Ambulatory Visit: Payer: BC Managed Care – PPO | Admitting: Internal Medicine

## 2023-10-12 ENCOUNTER — Encounter: Payer: Self-pay | Admitting: Internal Medicine

## 2023-10-12 NOTE — Progress Notes (Signed)
      Subjective:    Patient ID: Adriana Hansen, female    DOB: 03/19/1986, 38 y.o.   MRN: [id number]     HPI Adriana Hansen is here for follow up of her chronic medical problems.   ADD:  She is taking her medication as prescribed.  She feels the medication is effective.  She denies side effects, including palpitations, headaches, lightheadedness, decreased appetite and weight loss.    She is concerned about her weight.  Plans left to start regular exercise with a friend.  Has decreased to smoking some.  Medications and allergies reviewed with patient and updated if appropriate.  Current Outpatient Medications on File Prior to Visit  Medication Sig Dispense Refill   amphetamine -dextroamphetamine  (ADDERALL) 15 MG tablet Take 1 tablet by mouth 2 (two) times daily. 60 tablet 0   Multiple Vitamin (MULTIVITAMIN) capsule Take 1 capsule by mouth daily.     No current facility-administered medications on file prior to visit.     Review of Systems  Constitutional:  Negative for fever.  Respiratory:  Negative for shortness of breath.   Cardiovascular:  Negative for chest pain and palpitations.  Neurological:  Negative for light-headedness and headaches.  Hematological:  Does not bruise/bleed easily.       Objective:   Vitals:   10/17/23 0829  BP: 130/78  Pulse: 84  Temp: 98.4 F (36.9 C)  SpO2: 95%   BP Readings from Last 3 Encounters:  10/17/23 130/78  04/08/23 126/74  10/06/22 134/80   Wt Readings from Last 3 Encounters:  10/17/23 210 lb (95.3 kg)  04/08/23 211 lb (95.7 kg)  10/06/22 217 lb (98.4 kg)   Body mass index is 34.95 kg/m.    Physical Exam Constitutional:      General: She is not in acute distress.    Appearance: Normal appearance. She is not ill-appearing.  HENT:     Head: Normocephalic and atraumatic.  Skin:    General: Skin is warm and dry.     Findings: No bruising or rash.  Neurological:     Mental Status: She is alert. Mental status is at  baseline.  Psychiatric:        Mood and Affect: Mood normal.        Behavior: Behavior normal.        Thought Content: Thought content normal.        Judgment: Judgment normal.        Lab Results  Component Value Date   WBC 9.4 04/08/2023   HGB 13.6 04/08/2023   HCT 40.6 04/08/2023   PLT 502.0 (H) 04/08/2023   GLUCOSE 84 04/08/2023   CHOL 189 04/08/2023   TRIG 96.0 04/08/2023   HDL 40.70 04/08/2023   LDLCALC 129 (H) 04/08/2023   ALT 12 04/08/2023   AST 14 04/08/2023   NA 140 04/08/2023   K 4.6 04/08/2023   CL 103 04/08/2023   CREATININE 0.72 04/08/2023   BUN 13 04/08/2023   CO2 28 04/08/2023   TSH 2.29 04/08/2023     Assessment & Plan:    See Problem List for Assessment and Plan of chronic medical problems.

## 2023-10-12 NOTE — Patient Instructions (Addendum)
     Have blood work done today.     Medications changes include :   None      Return in about 6 months (around 04/18/2024) for Physical Exam.

## 2023-10-17 ENCOUNTER — Ambulatory Visit (INDEPENDENT_AMBULATORY_CARE_PROVIDER_SITE_OTHER): Admitting: Internal Medicine

## 2023-10-17 VITALS — BP 130/78 | HR 84 | Temp 98.4°F | Ht 65.0 in | Wt 210.0 lb

## 2023-10-17 DIAGNOSIS — R41840 Attention and concentration deficit: Secondary | ICD-10-CM | POA: Diagnosis not present

## 2023-10-17 DIAGNOSIS — E7800 Pure hypercholesterolemia, unspecified: Secondary | ICD-10-CM | POA: Diagnosis not present

## 2023-10-17 DIAGNOSIS — D75839 Thrombocytosis, unspecified: Secondary | ICD-10-CM

## 2023-10-17 LAB — LIPID PANEL
Cholesterol: 168 mg/dL (ref 0–200)
HDL: 39.5 mg/dL (ref >39.00)
LDL Cholesterol: 116 mg/dL — ABNORMAL HIGH (ref 0–99)
NonHDL: 128.7
Total CHOL/HDL Ratio: 4
Triglycerides: 65 mg/dL (ref 0.0–149.0)
VLDL: 13 mg/dL (ref 0.0–40.0)

## 2023-10-17 LAB — CBC WITH DIFFERENTIAL/PLATELET
Basophils Absolute: 0 K/uL (ref 0.0–0.1)
Basophils Relative: 0.3 % (ref 0.0–3.0)
Eosinophils Absolute: 0.1 K/uL (ref 0.0–0.7)
Eosinophils Relative: 0.8 % (ref 0.0–5.0)
HCT: 37.6 % (ref 36.0–46.0)
Hemoglobin: 12.6 g/dL (ref 12.0–15.0)
Lymphocytes Relative: 23.1 % (ref 12.0–46.0)
Lymphs Abs: 2.1 K/uL (ref 0.7–4.0)
MCHC: 33.6 g/dL (ref 30.0–36.0)
MCV: 86 fl (ref 78.0–100.0)
Monocytes Absolute: 0.8 K/uL (ref 0.1–1.0)
Monocytes Relative: 9.5 % (ref 3.0–12.0)
Neutro Abs: 5.9 K/uL (ref 1.4–7.7)
Neutrophils Relative %: 66.3 % (ref 43.0–77.0)
Platelets: 420 K/uL — ABNORMAL HIGH (ref 150.0–400.0)
RBC: 4.37 Mil/uL (ref 3.87–5.11)
RDW: 13.3 % (ref 11.5–15.5)
WBC: 8.9 K/uL (ref 4.0–10.5)

## 2023-10-17 LAB — FERRITIN: Ferritin: 19.4 ng/mL (ref 10.0–291.0)

## 2023-10-17 LAB — IBC PANEL
Iron: 53 ug/dL (ref 42–145)
Saturation Ratios: 15.8 % — ABNORMAL LOW (ref 20.0–50.0)
TIBC: 334.6 ug/dL (ref 250.0–450.0)
Transferrin: 239 mg/dL (ref 212.0–360.0)

## 2023-10-17 NOTE — Assessment & Plan Note (Signed)
 Chronic Controlled, Stable Sometimes medication does not seem to work as well as others-likely related to different generics month-to-month Continue Adderall 15 mg twice daily

## 2023-10-17 NOTE — Assessment & Plan Note (Addendum)
 Chronic Elevated since 2018-mild, but 6 months ago it was higher Check CBC, iron panel today-will consider heme-onc referral

## 2023-10-19 ENCOUNTER — Ambulatory Visit: Payer: Self-pay | Admitting: Internal Medicine

## 2023-10-19 DIAGNOSIS — F988 Other specified behavioral and emotional disorders with onset usually occurring in childhood and adolescence: Secondary | ICD-10-CM

## 2023-10-27 MED ORDER — AMPHETAMINE-DEXTROAMPHETAMINE 15 MG PO TABS: 15.0000 mg | ORAL_TABLET | Freq: Two times a day (BID) | ORAL | 0 refills | Status: DC

## 2023-11-24 ENCOUNTER — Encounter: Payer: Self-pay | Admitting: Internal Medicine

## 2023-11-24 DIAGNOSIS — F988 Other specified behavioral and emotional disorders with onset usually occurring in childhood and adolescence: Secondary | ICD-10-CM

## 2023-11-24 MED ORDER — AMPHETAMINE-DEXTROAMPHETAMINE 15 MG PO TABS
15.0000 mg | ORAL_TABLET | Freq: Two times a day (BID) | ORAL | 0 refills | Status: DC
Start: 2023-11-24 — End: 2023-12-27

## 2023-12-26 ENCOUNTER — Encounter: Payer: Self-pay | Admitting: Internal Medicine

## 2023-12-26 DIAGNOSIS — F988 Other specified behavioral and emotional disorders with onset usually occurring in childhood and adolescence: Secondary | ICD-10-CM

## 2023-12-27 MED ORDER — AMPHETAMINE-DEXTROAMPHETAMINE 15 MG PO TABS: 15.0000 mg | ORAL_TABLET | Freq: Two times a day (BID) | ORAL | 0 refills | Status: DC

## 2024-01-24 ENCOUNTER — Encounter: Payer: Self-pay | Admitting: Internal Medicine

## 2024-01-24 DIAGNOSIS — F988 Other specified behavioral and emotional disorders with onset usually occurring in childhood and adolescence: Secondary | ICD-10-CM

## 2024-01-25 MED ORDER — AMPHETAMINE-DEXTROAMPHETAMINE 15 MG PO TABS: 15.0000 mg | ORAL_TABLET | Freq: Two times a day (BID) | ORAL | 0 refills | Status: DC

## 2024-02-22 ENCOUNTER — Encounter: Payer: Self-pay | Admitting: Internal Medicine

## 2024-02-22 DIAGNOSIS — F988 Other specified behavioral and emotional disorders with onset usually occurring in childhood and adolescence: Secondary | ICD-10-CM

## 2024-02-22 MED ORDER — AMPHETAMINE-DEXTROAMPHETAMINE 15 MG PO TABS: 15.0000 mg | ORAL_TABLET | Freq: Two times a day (BID) | ORAL | 0 refills | Status: DC

## 2024-03-26 ENCOUNTER — Encounter: Payer: Self-pay | Admitting: Internal Medicine

## 2024-03-26 ENCOUNTER — Other Ambulatory Visit: Payer: Self-pay | Admitting: Internal Medicine

## 2024-03-26 DIAGNOSIS — F988 Other specified behavioral and emotional disorders with onset usually occurring in childhood and adolescence: Secondary | ICD-10-CM

## 2024-03-26 MED ORDER — AMPHETAMINE-DEXTROAMPHETAMINE 15 MG PO TABS: 15.0000 mg | ORAL_TABLET | Freq: Two times a day (BID) | ORAL | 0 refills | Status: DC

## 2024-03-26 NOTE — Telephone Encounter (Signed)
 Copied from CRM #[id number]. Topic: Clinical - Medication Refill >> Mar 26, 2024 11:38 AM Tiffini S wrote: Medication: amphetamine -dextroamphetamine  (ADDERALL) 15 MG tablet  Has the patient contacted their pharmacy? Yes (Agent: If no, request that the patient contact the pharmacy for the refill. If patient does not wish to contact the pharmacy document the reason why and proceed with request.) (Agent: If yes, when and what did the pharmacy advise?)  This is the patient's preferred pharmacy:  [facility] [id number] - [place], [place] - [address] [place] [address] Phone: [phone] Fax: [phone]   Is this the correct pharmacy for this prescription? Yes If no, delete pharmacy and type the correct one.   Has the prescription been filled recently? Yes  Is the patient out of the medication? No  Has the patient been seen for an appointment in the last year OR does the patient have an upcoming appointment? Yes  Can we respond through MyChart? Yes  Agent: Please be advised that Rx refills may take up to 3 business days. We ask that you follow-up with your pharmacy.

## 2024-04-18 NOTE — Progress Notes (Unsigned)
 "   Subjective:    Patient ID: Adriana Hansen, female    DOB: August 30, 1985, 39 y.o.   MRN: [id number]      HPI Adriana Hansen is here for a Physical exam and her chronic medical problems.   Bump in left lower leg - intermittent - no pain. She notices it mostly when she is in the shower.    No concerns.    Medications and allergies reviewed with patient and updated if appropriate.  Medications Ordered Prior to Encounter[1]  Review of Systems  Constitutional:  Negative for fever.  Eyes:  Negative for visual disturbance.  Respiratory:  Negative for cough, shortness of breath and wheezing.   Cardiovascular:  Negative for chest pain, palpitations and leg swelling.  Gastrointestinal:  Negative for abdominal pain, blood in stool, constipation and diarrhea.       No gerd  Genitourinary:  Negative for dysuria.  Musculoskeletal:  Negative for arthralgias and back pain.  Skin:  Negative for rash.  Neurological:  Negative for light-headedness and headaches.  Psychiatric/Behavioral:  Negative for dysphoric mood. The patient is not nervous/anxious.        Objective:   Vitals:   04/19/24 0827  BP: 136/80  Pulse: 85  Temp: 97.9 F (36.6 C)  SpO2: 100%   Filed Weights   04/19/24 0827  Weight: 209 lb (94.8 kg)   Body mass index is 34.78 kg/m.  BP Readings from Last 3 Encounters:  04/19/24 136/80  10/17/23 130/78  04/08/23 126/74    Wt Readings from Last 3 Encounters:  04/19/24 209 lb (94.8 kg)  10/17/23 210 lb (95.3 kg)  04/08/23 211 lb (95.7 kg)       Physical Exam Constitutional: She appears well-developed and well-nourished. No distress.  HENT:  Head: Normocephalic and atraumatic.  Right Ear: External ear normal. Normal ear canal and TM Left Ear: External ear normal.  Normal ear canal and TM Mouth/Throat: Oropharynx is clear and moist.  Eyes: Conjunctivae normal.  Neck: Neck supple. No tracheal deviation present. No thyromegaly present.  No carotid bruit   Cardiovascular: Normal rate, regular rhythm and normal heart sounds.   No murmur heard.  No edema. Pulmonary/Chest: Effort normal and breath sounds normal. No respiratory distress. She has no wheezes. She has no rales.  Breast: deferred   Abdominal: Soft. She exhibits no distension. There is no tenderness.  Lymphadenopathy: She has no cervical adenopathy.  Skin: Skin is warm and dry. She is not diaphoretic.  Psychiatric: She has a normal mood and affect. Her behavior is normal.     Lab Results  Component Value Date   WBC 8.9 10/17/2023   HGB 12.6 10/17/2023   HCT 37.6 10/17/2023   PLT 420.0 (H) 10/17/2023   GLUCOSE 84 04/08/2023   CHOL 168 10/17/2023   TRIG 65.0 10/17/2023   HDL 39.50 10/17/2023   LDLCALC 116 (H) 10/17/2023   ALT 12 04/08/2023   AST 14 04/08/2023   NA 140 04/08/2023   K 4.6 04/08/2023   CL 103 04/08/2023   CREATININE 0.72 04/08/2023   BUN 13 04/08/2023   CO2 28 04/08/2023   TSH 2.29 04/08/2023         Assessment & Plan:   Physical exam: Screening blood work  ordered Exercise  walking Weight  obese Substance abuse  vaping - has decreased how much   Reviewed recommended immunizations.   Health Maintenance  Topic Date Due   Cervical Cancer Screening (HPV/Pap Cotest)  01/06/2021   Mammogram  Never done   COVID-19 Vaccine (3 - 2025-26 season) 05/04/2024 (Originally 11/28/2023)   Influenza Vaccine  06/26/2024 (Originally 10/28/2023)   Pneumococcal Vaccine (1 of 2 - PCV) 04/19/2025 (Originally 04/27/2002)   Hepatitis B Vaccines 19-59 Average Risk (1 of 3 - 19+ 3-dose series) 04/19/2025 (Originally 04/27/2002)   DTaP/Tdap/Td (3 - Td or Tdap) 09/10/2029   HPV VACCINES (No Doses Required) Completed   Hepatitis C Screening  Completed   HIV Screening  Completed   Meningococcal B Vaccine  Aged Out          See Problem List for Assessment and Plan of chronic medical problems.        [1]  Current Outpatient Medications on File Prior to Visit   Medication Sig Dispense Refill   amphetamine -dextroamphetamine  (ADDERALL) 15 MG tablet Take 1 tablet by mouth 2 (two) times daily. 60 tablet 0   Multiple Vitamin (MULTIVITAMIN) capsule Take 1 capsule by mouth daily.     No current facility-administered medications on file prior to visit.   "

## 2024-04-18 NOTE — Patient Instructions (Addendum)
 "     Blood work was ordered.       Medications changes include :   None    A referral was ordered and someone will call you to schedule an appointment.     Return in about 6 months (around 10/17/2024) for follow up.    Health Maintenance, Female Adopting a healthy lifestyle and getting preventive care are important in promoting health and wellness. Ask your health care provider about: The right schedule for you to have regular tests and exams. Things you can do on your own to prevent diseases and keep yourself healthy. What should I know about diet, weight, and exercise? Eat a healthy diet  Eat a diet that includes plenty of vegetables, fruits, low-fat dairy products, and lean protein. Do not eat a lot of foods that are high in solid fats, added sugars, or sodium. Maintain a healthy weight Body mass index (BMI) is used to identify weight problems. It estimates body fat based on height and weight. Your health care provider can help determine your BMI and help you achieve or maintain a healthy weight. Get regular exercise Get regular exercise. This is one of the most important things you can do for your health. Most adults should: Exercise for at least 150 minutes each week. The exercise should increase your heart rate and make you sweat (moderate-intensity exercise). Do strengthening exercises at least twice a week. This is in addition to the moderate-intensity exercise. Spend less time sitting. Even light physical activity can be beneficial. Watch cholesterol and blood lipids Have your blood tested for lipids and cholesterol at 41 years of age, then have this test every 5 years. Have your cholesterol levels checked more often if: Your lipid or cholesterol levels are high. You are older than 41 years of age. You are at high risk for heart disease. What should I know about cancer screening? Depending on your health history and family history, you may need to have cancer  screening at various ages. This may include screening for: Breast cancer. Cervical cancer. Colorectal cancer. Skin cancer. Lung cancer. What should I know about heart disease, diabetes, and high blood pressure? Blood pressure and heart disease High blood pressure causes heart disease and increases the risk of stroke. This is more likely to develop in people who have high blood pressure readings or are overweight. Have your blood pressure checked: Every 3-5 years if you are 25-54 years of age. Every year if you are 31 years old or older. Diabetes Have regular diabetes screenings. This checks your fasting blood sugar level. Have the screening done: Once every three years after age 69 if you are at a normal weight and have a low risk for diabetes. More often and at a younger age if you are overweight or have a high risk for diabetes. What should I know about preventing infection? Hepatitis B If you have a higher risk for hepatitis B, you should be screened for this virus. Talk with your health care provider to find out if you are at risk for hepatitis B infection. Hepatitis C Testing is recommended for: Everyone born from [birth year] through 1967. Anyone with known risk factors for hepatitis C. Sexually transmitted infections (STIs) Get screened for STIs, including gonorrhea and chlamydia, if: You are sexually active and are younger than 41 years of age. You are older than 41 years of age and your health care provider tells you that you are at risk for this type of infection. Your sexual  activity has changed since you were last screened, and you are at increased risk for chlamydia or gonorrhea. Ask your health care provider if you are at risk. Ask your health care provider about whether you are at high risk for HIV. Your health care provider may recommend a prescription medicine to help prevent HIV infection. If you choose to take medicine to prevent HIV, you should first get tested for HIV. You  should then be tested every 3 months for as long as you are taking the medicine. Pregnancy If you are about to stop having your period (premenopausal) and you may become pregnant, seek counseling before you get pregnant. Take 400 to 800 micrograms (mcg) of folic acid every day if you become pregnant. Ask for birth control (contraception) if you want to prevent pregnancy. Osteoporosis and menopause Osteoporosis is a disease in which the bones lose minerals and strength with aging. This can result in bone fractures. If you are 9 years old or older, or if you are at risk for osteoporosis and fractures, ask your health care provider if you should: Be screened for bone loss. Take a calcium or vitamin D supplement to lower your risk of fractures. Be given hormone replacement therapy (HRT) to treat symptoms of menopause. Follow these instructions at home: Alcohol use Do not drink alcohol if: Your health care provider tells you not to drink. You are pregnant, may be pregnant, or are planning to become pregnant. If you drink alcohol: Limit how much you have to: 0-1 drink a day. Know how much alcohol is in your drink. In the [place] one drink equals one 12 oz bottle of beer (355 mL), one 5 oz glass of wine (148 mL), or one 1 oz glass of hard liquor (44 mL). Lifestyle Do not use any products that contain nicotine or tobacco. These products include cigarettes, chewing tobacco, and vaping devices, such as e-cigarettes. If you need help quitting, ask your health care provider. Do not use street drugs. Do not share needles. Ask your health care provider for help if you need support or information about quitting drugs. General instructions Schedule regular health, dental, and eye exams. Stay current with your vaccines. Tell your health care provider if: You often feel depressed. You have ever been abused or do not feel safe at home. Summary Adopting a healthy lifestyle and getting preventive care are  important in promoting health and wellness. Follow your health care provider's instructions about healthy diet, exercising, and getting tested or screened for diseases. Follow your health care provider's instructions on monitoring your cholesterol and blood pressure. This information is not intended to replace advice given to you by your health care provider. Make sure you discuss any questions you have with your health care provider. Document Revised: 08/04/2020 Document Reviewed: 08/04/2020 [employer]vier Patient Education  2024 [employer]. "

## 2024-04-19 ENCOUNTER — Ambulatory Visit: Payer: Self-pay | Admitting: Internal Medicine

## 2024-04-19 VITALS — BP 136/80 | HR 85 | Temp 97.9°F | Ht 65.0 in | Wt 209.0 lb

## 2024-04-19 DIAGNOSIS — F17290 Nicotine dependence, other tobacco product, uncomplicated: Secondary | ICD-10-CM | POA: Diagnosis not present

## 2024-04-19 DIAGNOSIS — Z6834 Body mass index (BMI) 34.0-34.9, adult: Secondary | ICD-10-CM

## 2024-04-19 DIAGNOSIS — E669 Obesity, unspecified: Secondary | ICD-10-CM

## 2024-04-19 DIAGNOSIS — F988 Other specified behavioral and emotional disorders with onset usually occurring in childhood and adolescence: Secondary | ICD-10-CM | POA: Diagnosis not present

## 2024-04-19 DIAGNOSIS — Z0000 Encounter for general adult medical examination without abnormal findings: Secondary | ICD-10-CM | POA: Diagnosis not present

## 2024-04-19 DIAGNOSIS — R41840 Attention and concentration deficit: Secondary | ICD-10-CM | POA: Diagnosis not present

## 2024-04-19 DIAGNOSIS — F17210 Nicotine dependence, cigarettes, uncomplicated: Secondary | ICD-10-CM

## 2024-04-19 DIAGNOSIS — D75839 Thrombocytosis, unspecified: Secondary | ICD-10-CM

## 2024-04-19 LAB — COMPREHENSIVE METABOLIC PANEL WITH GFR
ALT: 10 U/L (ref 3–35)
AST: 12 U/L (ref 5–37)
Albumin: 4.4 g/dL (ref 3.5–5.2)
Alkaline Phosphatase: 74 U/L (ref 39–117)
BUN: 13 mg/dL (ref 6–23)
CO2: 27 meq/L (ref 19–32)
Calcium: 9 mg/dL (ref 8.4–10.5)
Chloride: 105 meq/L (ref 96–112)
Creatinine, Ser: 0.67 mg/dL (ref 0.40–1.20)
GFR: 108.9 mL/min
Glucose, Bld: 86 mg/dL (ref 70–99)
Potassium: 4.5 meq/L (ref 3.5–5.1)
Sodium: 138 meq/L (ref 135–145)
Total Bilirubin: 0.5 mg/dL (ref 0.2–1.2)
Total Protein: 7 g/dL (ref 6.0–8.3)

## 2024-04-19 LAB — LIPID PANEL
Cholesterol: 166 mg/dL (ref 28–200)
HDL: 40 mg/dL
LDL Cholesterol: 112 mg/dL — ABNORMAL HIGH (ref 10–99)
NonHDL: 126.09
Total CHOL/HDL Ratio: 4
Triglycerides: 70 mg/dL (ref 10.0–149.0)
VLDL: 14 mg/dL (ref 0.0–40.0)

## 2024-04-19 LAB — TSH: TSH: 2.25 u[IU]/mL (ref 0.35–5.50)

## 2024-04-19 LAB — CBC
HCT: 37.4 % (ref 36.0–46.0)
Hemoglobin: 13 g/dL (ref 12.0–15.0)
MCHC: 34.8 g/dL (ref 30.0–36.0)
MCV: 85.8 fl (ref 78.0–100.0)
Platelets: 418 K/uL — ABNORMAL HIGH (ref 150.0–400.0)
RBC: 4.36 Mil/uL (ref 3.87–5.11)
RDW: 13 % (ref 11.5–15.5)
WBC: 8.3 K/uL (ref 4.0–10.5)

## 2024-04-19 MED ORDER — AMPHETAMINE-DEXTROAMPHETAMINE 15 MG PO TABS: 15.0000 mg | ORAL_TABLET | Freq: Two times a day (BID) | ORAL | 0 refills | Status: AC

## 2024-04-19 NOTE — Assessment & Plan Note (Addendum)
 Chronic BMI 34.78 Encouraged weight loss Increase regular exercise Advised decreased portions, diet high in protein, vegetables and fiber

## 2024-04-19 NOTE — Assessment & Plan Note (Signed)
 Chronic Controlled, Stable Sometimes medication does not seem to work as well as others-likely related to different generics month-to-month Continue Adderall 15 mg twice daily

## 2024-04-19 NOTE — Assessment & Plan Note (Signed)
 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

## 2024-04-19 NOTE — Assessment & Plan Note (Addendum)
 No longer smoking cigarettes - she is vaping   Stressed cessation-she has cut back

## 2024-10-22 ENCOUNTER — Ambulatory Visit: Admitting: Internal Medicine
